# Patient Record
Sex: Female | Born: 1950 | Race: White | Hispanic: No | Marital: Married | Smoking: Never smoker
Health system: Southern US, Community
[De-identification: ages and names within clinical notes are randomized; demographics above are authoritative.]

## PROBLEM LIST (undated history)

## (undated) DIAGNOSIS — Z9089 Acquired absence of other organs: Secondary | ICD-10-CM

## (undated) DIAGNOSIS — T7840XA Allergy, unspecified, initial encounter: Secondary | ICD-10-CM

## (undated) DIAGNOSIS — R112 Nausea with vomiting, unspecified: Secondary | ICD-10-CM

## (undated) DIAGNOSIS — Z9889 Other specified postprocedural states: Secondary | ICD-10-CM

## (undated) DIAGNOSIS — R0600 Dyspnea, unspecified: Secondary | ICD-10-CM

## (undated) DIAGNOSIS — K219 Gastro-esophageal reflux disease without esophagitis: Secondary | ICD-10-CM

## (undated) DIAGNOSIS — M1990 Unspecified osteoarthritis, unspecified site: Secondary | ICD-10-CM

## (undated) DIAGNOSIS — E7800 Pure hypercholesterolemia, unspecified: Secondary | ICD-10-CM

## (undated) HISTORY — DX: Allergy, unspecified, initial encounter: T78.40XA

## (undated) HISTORY — PX: TONSILLECTOMY: SUR1361

## (undated) HISTORY — DX: Acquired absence of other organs: Z90.89

## (undated) HISTORY — DX: Pure hypercholesterolemia, unspecified: E78.00

## (undated) HISTORY — PX: EXTERNAL EAR SURGERY: SHX627

---

## 1985-01-22 HISTORY — PX: TUBAL LIGATION: SHX77

## 2000-01-23 HISTORY — PX: VAGINAL HYSTERECTOMY: SUR661

## 2005-01-04 ENCOUNTER — Ambulatory Visit: Payer: Self-pay | Admitting: Gastroenterology

## 2005-12-10 ENCOUNTER — Ambulatory Visit: Payer: Self-pay | Admitting: Family Medicine

## 2005-12-19 ENCOUNTER — Ambulatory Visit: Payer: Self-pay | Admitting: Family Medicine

## 2006-08-16 ENCOUNTER — Ambulatory Visit: Payer: Self-pay | Admitting: Family Medicine

## 2007-12-12 ENCOUNTER — Emergency Department: Payer: Self-pay | Admitting: Emergency Medicine

## 2008-01-23 HISTORY — PX: BREAST BIOPSY: SHX20

## 2008-02-24 ENCOUNTER — Ambulatory Visit: Payer: Self-pay | Admitting: Family Medicine

## 2008-03-02 ENCOUNTER — Ambulatory Visit: Payer: Self-pay | Admitting: Family Medicine

## 2008-04-15 ENCOUNTER — Ambulatory Visit: Payer: Self-pay | Admitting: Gastroenterology

## 2008-05-31 ENCOUNTER — Ambulatory Visit: Payer: Self-pay | Admitting: Surgery

## 2008-08-24 ENCOUNTER — Ambulatory Visit: Payer: Self-pay | Admitting: Surgery

## 2008-09-07 ENCOUNTER — Ambulatory Visit: Payer: Self-pay | Admitting: Surgery

## 2010-10-12 ENCOUNTER — Ambulatory Visit: Payer: Self-pay | Admitting: Family Medicine

## 2011-10-16 ENCOUNTER — Ambulatory Visit: Payer: Self-pay | Admitting: Family Medicine

## 2012-10-23 ENCOUNTER — Ambulatory Visit: Payer: Self-pay | Admitting: Family Medicine

## 2012-11-10 ENCOUNTER — Ambulatory Visit: Payer: Self-pay | Admitting: Family Medicine

## 2013-06-11 ENCOUNTER — Ambulatory Visit: Payer: Self-pay | Admitting: Gastroenterology

## 2013-11-26 ENCOUNTER — Ambulatory Visit: Payer: Self-pay | Admitting: Family Medicine

## 2013-12-04 ENCOUNTER — Ambulatory Visit: Payer: Self-pay | Admitting: Family Medicine

## 2015-03-29 ENCOUNTER — Other Ambulatory Visit: Payer: Self-pay | Admitting: Family Medicine

## 2015-03-29 DIAGNOSIS — R1011 Right upper quadrant pain: Secondary | ICD-10-CM

## 2015-04-05 ENCOUNTER — Ambulatory Visit
Admission: RE | Admit: 2015-04-05 | Discharge: 2015-04-05 | Disposition: A | Discharge status: Home / Self Care | Payer: BLUE CROSS/BLUE SHIELD | Source: Ambulatory Visit | Attending: Family Medicine | Admitting: Family Medicine

## 2015-04-05 DIAGNOSIS — R1011 Right upper quadrant pain: Secondary | ICD-10-CM

## 2015-04-06 ENCOUNTER — Ambulatory Visit
Admission: RE | Admit: 2015-04-06 | Discharge: 2015-04-06 | Disposition: A | Discharge status: Home / Self Care | Payer: BLUE CROSS/BLUE SHIELD | Source: Ambulatory Visit | Attending: Family Medicine | Admitting: Family Medicine

## 2015-04-06 DIAGNOSIS — R1011 Right upper quadrant pain: Secondary | ICD-10-CM | POA: Insufficient documentation

## 2017-09-04 ENCOUNTER — Other Ambulatory Visit: Payer: Self-pay | Admitting: Family Medicine

## 2017-09-04 DIAGNOSIS — Z1231 Encounter for screening mammogram for malignant neoplasm of breast: Secondary | ICD-10-CM

## 2017-09-18 ENCOUNTER — Encounter: Payer: Self-pay | Admitting: Radiology

## 2017-09-18 ENCOUNTER — Ambulatory Visit
Admission: RE | Admit: 2017-09-18 | Discharge: 2017-09-18 | Disposition: A | Discharge status: Home / Self Care | Payer: Medicare Other | Source: Ambulatory Visit | Attending: Family Medicine | Admitting: Family Medicine

## 2017-09-18 DIAGNOSIS — Z1231 Encounter for screening mammogram for malignant neoplasm of breast: Secondary | ICD-10-CM | POA: Insufficient documentation

## 2017-10-11 ENCOUNTER — Encounter: Payer: Self-pay | Admitting: Physical Therapy

## 2017-10-11 ENCOUNTER — Other Ambulatory Visit: Payer: Self-pay

## 2017-10-11 ENCOUNTER — Ambulatory Visit: Payer: Medicare Other | Attending: Obstetrics and Gynecology | Admitting: Physical Therapy

## 2017-10-11 DIAGNOSIS — M6281 Muscle weakness (generalized): Secondary | ICD-10-CM | POA: Diagnosis present

## 2017-10-11 DIAGNOSIS — R29898 Other symptoms and signs involving the musculoskeletal system: Secondary | ICD-10-CM | POA: Insufficient documentation

## 2017-10-11 DIAGNOSIS — Z90710 Acquired absence of both cervix and uterus: Secondary | ICD-10-CM | POA: Insufficient documentation

## 2017-10-11 DIAGNOSIS — R2689 Other abnormalities of gait and mobility: Secondary | ICD-10-CM | POA: Diagnosis not present

## 2017-10-11 DIAGNOSIS — R279 Unspecified lack of coordination: Secondary | ICD-10-CM | POA: Diagnosis present

## 2017-10-11 NOTE — Therapy (Addendum)
[place] MAIN [facility] SERVICES [place], [place], [address] Phone: [phone]   Fax:  [phone]  Physical Therapy Evaluation  Patient Details  Name: Kaitlyn Ponce MRN: [id number] Date of Birth: 1950/02/08 Referring Provider (PT): Anders Grant Date: 10/11/2017    Past Medical History:  Diagnosis Date  . Allergy    cat, dust  . High cholesterol    taking medication, declined nutritional referral at this time 10/11/17  . History of tonsillectomy     Past Surgical History:  Procedure Laterality Date  . BREAST BIOPSY Left 2010   [place]  . TUBAL LIGATION  1987  . VAGINAL HYSTERECTOMY  2002    There were no vitals filed for this visit.     Subjective Assessment - 10/11/17 0843    Subjective 1) Pt has been feeling the prolapse for one year but didn't know what it once. It felt like "my insides were falling out."  Bowel movements occur 1-2 x day without straining with Stool Type 4. Denied urinary frequency, straining with bowel movements, initiating urination, urnary /fecal leakage.  The pressure feeling occurs when she is rushed, lifting heavy objects (gardening, bending, weeding, carrying thing upstairs, standing/cooking for all periods of time).  This pressure comes and goes and it is not constant.    2) CLBP occurs occasionally especially with cooking which invoves standing for 45 min. Pt has to sit done and rest for 30 min.  Pain level 6-7/10 as a dull ache that is not debilitating. Denied radiating pain, nor with bowel and bladder Sx.     3) B knee pain with R worse than L. Sitting with no pain. There are times when her knees feels like they have locked in the wrong position. This happens 1-2 x week.  Pt has hto move it to "literally" it has to click back in place". Pt 's orthopedic doctor has advised for pt to lose weight before being eligible for knee pain. Pt declined a cortisone shot offered by the PA at  her orthopedic office.  After walking 2 miles, her legs ache and she has to stop and has to rest for 5 minutes.  Pt's weight loss efforts include weaning off sweet tea ( 5) 16 fl oz to (1) per day and increasing water from none to 50 fl oz.  Pt also has 1 cup of coffee in the morning and evening.    4) Nocturia 1 x / night without leakage. Pt has never been tested for OSA. Pt knows she snores because when she is on her back. she wakes herself up.      Pertinent History    Umbilical Hernia for 1 year under monitoring, Gynecological Hx: large baby with 1st pregnancy which required stitches, vaginal hysterectomy, and tubal ligation. Pt is retired. Pt usually has her legs elevated on an ottoman when reading a book or sitting.  Walking daily 2 miles with some incline. Pt has a pool outdoors but will not be using it with the season change. Pt used the pool for walking earlier in the summer. Pt will take time to consider pool therapy for strengthening legs.            [facility] PT Assessment - 10/11/17 0911      Assessment   Medical Diagnosis  pelvic organ prolapse     Referring Provider  [facility]       Precautions   Precautions  None  Restrictions   Weight Bearing Restrictions  No      Balance Screen   Has the patient fallen in the past 6 months  No      AROM   Overall AROM Comments  WFL in rotation, sidebend, flexion, ext      Strength   Overall Strength  --       Overall Strength Comments  R hip /knee flex, knee ext 4/5, L3+/5. hip extR 4-/5L 2/5, hip abd R 4+/5, L 2/5       Palpation   Spinal mobility  dowager's hump     Palpation comment  L Iliac crest higher than R.  82.5 cm RLE, 84 cm L         Ambulation/Gait   Gait velocity  1.08 m/s ( after 1.20 m/s with shoe lift in R shoe)     Gait Comments  L lateral pelvic shift, decreased R stance ( no L lateral shift w/ shoe lift in R shoe)                  Objective measurements completed on examination: See above  findings.    Pelvic Floor Special Questions - 10/11/17 0925    Diastasis Recti  neg but an umbilical hernia convex 2 cm at its peak                     PT Long Term Goals - 10/11/17 0920      PT LONG TERM GOAL #1   Title  Pt will increase her hip ext/ abd strength on her L from 2/5 to > 4+/5 in order to equalize her strength to R LE for improved knee pain and to navigate stairs     Time  8    Period  Weeks    Status  New    Target Date  12/06/17      PT LONG TERM GOAL #2   Title  Pt will demo equal pelvic alignment of iliac crest across 2 visits with compliance of shoe lift and increase gait speed 1.3 m/s in order to walk longer distances     Time  12    Period  Weeks    Status  New    Target Date  01/03/18      PT LONG TERM GOAL #3   Title  Pt will report decreased pain at the back from 6-7/10 to <3/10 and knee pain 5-6/10 to <3/10 after standing in one place while cooking for 45 min in order to coo for family      Time  4    Period  Weeks    Status  New    Target Date  11/08/17      PT LONG TERM GOAL #4   Title  Pt will increase her LEFS score from 46 pts to > 66 pts in order to improve knee function  and decrease pain     Time  10    Period  Weeks    Status  New    Target Date  12/20/17                     Objective measurements completed on examination: See above findings.                       Plan - 10/19/17 1701     Pt is a 67 yo female who reports of prolapse-related pressure  feeling in her pelvic area, non-radiating CLBP, b knee pain ( R> L), and nocturia. These Sx impact her QOL and ADLs. Pt's clinical presentations include leg length difference, hip weakness, gait deviations, slowed gait, dyscoordination of deep core mm, and poor body mechanics. Following Tx, pt showed faster gait speed with less deviations when wearing shoe lift in RLE. Plan to assess pelvic floor to address prolapse at next session.  Regional  interdependent approaches will yield greater benefits in pt's POC due to the complexity of her medical Hx and Sx at multiple body parts at lower kinetic chain.  Pt was provided education on etiology of Sx with anatomy, physiology explanation with images along with the benefits of customized pelvic PT Tx based on her medical conditions and musculoskeletal deficits.     Clinical Presentation  Evolving    Clinical Decision Making  Moderate    Rehab Potential  Good    PT Frequency  1x / week    PT Duration  12 weeks    PT Treatment/Interventions  Therapeutic activities;Neuromuscular re-education;Therapeutic exercise;Moist Heat;Electrical Stimulation;Balance training;Gait training;Energy conservation;Manual techniques;Scar mobilization;Taping;Patient/family education;Traction;Biofeedback;Aquatic Therapy    Consulted and Agree with Plan of Care  Patient       Patient will benefit from skilled therapeutic intervention in order to improve the following deficits and impairments:  Decreased activity tolerance, Decreased coordination, Decreased scar mobility, Decreased range of motion, Decreased safety awareness, Decreased strength, Hypermobility, Pain, Postural dysfunction, Decreased mobility, Decreased endurance, Improper body mechanics, Hypomobility, Increased muscle spasms, Impaired sensation, Increased fascial restricitons  Visit Diagnosis: Other symptoms and signs involving the musculoskeletal system  H/O vaginal hysterectomy  Unspecified lack of coordination  Muscle weakness (generalized)     Problem List Patient Active Problem List   Diagnosis Date Noted  . H/O vaginal hysterectomy     [name] ,PT, DPT, E-RYT   10/19/2017, 5:02 PM  [place] MAIN [facility] SERVICES [address] [place], [place], [address] Phone: [phone]   Fax:  [phone]  Name: Shavonne Ambroise MRN: [id number] Date of Birth: [birth year]

## 2017-10-11 NOTE — Patient Instructions (Addendum)
Wear shoe lift in R shoe to level up your hips    _________  Strengthen L hip:  Clam shells  on laying on R side Knees bent, pillow between knees  Left L knee up before the point of rocking hips back ( keep the front of hips pointed ahead)  30 reps   X 2 x day   ___________  Stand at the [employer] with hands on counter and tapping back with each leg 30 reps  X 3 reps    _____________    Proper body mechanics with getting out of a chair to decrease strain  on back &pelvic floor   Avoid holding your breath when Getting out of the chair:  Scoot to front part of chair chair Heels behind feet, feet are hip width apart, nose over toes  Inhale like you are smelling roses Exhale to stand

## 2017-10-14 ENCOUNTER — Ambulatory Visit: Payer: Medicare Other | Admitting: Physical Therapy

## 2017-10-14 DIAGNOSIS — M6281 Muscle weakness (generalized): Secondary | ICD-10-CM

## 2017-10-14 DIAGNOSIS — R29898 Other symptoms and signs involving the musculoskeletal system: Secondary | ICD-10-CM

## 2017-10-14 DIAGNOSIS — Z90710 Acquired absence of both cervix and uterus: Secondary | ICD-10-CM

## 2017-10-14 DIAGNOSIS — R279 Unspecified lack of coordination: Secondary | ICD-10-CM

## 2017-10-14 NOTE — Patient Instructions (Addendum)
Deep core level 1 ( with quick squeeze)   Deep core level 2 ( 6 min )  2x  Day for both See handout)   ______  Hold off on clams since you experienced cramps in the morning when doing them  ____ Stretch L pelvic floor with scissors movements/ modification for clams   Inhale Exhale dragging heel out and in with knees straight 30 reps    _____    Avoid straining pelvic floor, abdominal muscles , spine  Use log rolling technique instead of getting out of bed with your neck or the sit-up   Log rolling into and out of .bed  With sidelying position first

## 2017-10-15 ENCOUNTER — Encounter: Payer: Medicare Other | Admitting: Physical Therapy

## 2017-10-16 NOTE — Therapy (Addendum)
[place] MAIN [facility] SERVICES [address] [place], [place], [address] Phone: [phone]   Fax:  [phone]  Physical Therapy Treatment  Patient Details  Name: Sparkles Mcneely MRN: [id number] Date of Birth: 23-Sep-1950 Referring Provider (PT): Leafy Ro    Encounter Date: 10/14/2017  PT End of Session - 10/19/17 1736    Visit Number  2    Number of Visits  12    Authorization Type  medicare     PT Start Time  2725    PT Stop Time  1515    PT Time Calculation (min)  60 min    Activity Tolerance  Patient tolerated treatment well;No increased pain    Behavior During Therapy  WFL for tasks assessed/performed       Past Medical History:  Diagnosis Date  . Allergy    cat, dust  . High cholesterol    taking medication, declined nutritional referral at this time 10/11/17  . History of tonsillectomy     Past Surgical History:  Procedure Laterality Date  . BREAST BIOPSY Left 2010   [place] core  . TUBAL LIGATION  1987  . VAGINAL HYSTERECTOMY  2002    There were no vitals filed for this visit.  Subjective Assessment - 10/19/17 1731    Subjective  Pt has not had the sinking feeling since seeing Leafy Ro even with active activities. Pt notices knee pain when doing her clam shell exercises in the morning.     Pertinent History  Umbilical Hernia for 1 year under monitoring, Gynecological Hx: large baby with 1st pregnancy which required stitches, vaginal hysterectomy, and tubal ligation. Pt is retired. Pt usually has her legs elevated on an ottoman when reading a book or sitting.  Walking daily 2 miles with some incline. Pt has a pool outdoors but will not be using it with the season change. Pt used the pool for walking earlier in the summer. Pt will take time to consider pool therapy for strengthening legs.            [facility] PT Assessment - 10/19/17 1739      Coordination   Gross Motor Movements are Fluid and Coordinated  --   minor cues for  deep core exercises     Bed Mobility   Right Sidelying to Sit  --    by hooking legs unde bed, breathholding, strainning of knee     Pelvic floor assessment: pt consented verbally without contraindication  cued for cranial movemetn of pelvic floor              [facility] Adult PT Treatment/Exercise - 10/19/17 1732      Therapeutic Activites    Therapeutic Activities  --   see pt instructions     Neuro Re-ed    Neuro Re-ed Details   see pt instructions                  PT Long Term Goals - 10/11/17 0920      PT LONG TERM GOAL #1   Title  Pt will increase her hip ext/ abd strength on her L from 2/5 to > 4+/5 in order to equalize her strength to R LE for improved knee pain and to navigate stairs     Time  8    Period  Weeks    Status  New    Target Date  12/06/17      PT LONG TERM GOAL #2   Title  Pt will demo equal pelvic alignment of iliac crest across 2 visits with compliance of shoe lift and increase gait speed 1.3 m/s in order to walk longer distances     Time  12    Period  Weeks    Status  New    Target Date  01/03/18      PT LONG TERM GOAL #3   Title  Pt will report decreased pain at the back from 6-7/10 to <3/10 and knee pain 5-6/10 to <3/10 after standing in one place while cooking for 45 min in order to coo for family      Time  4    Period  Weeks    Status  New    Target Date  11/08/17      PT LONG TERM GOAL #4   Title  Pt will increase her LEFS score from 46 pts to > 66 pts in order to improve knee function  and decrease pain     Time  10    Period  Weeks    Status  New    Target Date  12/20/17            Plan - 10/19/17 1737    Clinical Impression Statement  Withheld clam shells due to report of knee pain. Pt demo'd poor body mechanics with sidelying to sit which is likely attributing to her knee pain as she demo'd hooking her legs under plinth to sit up. Pt also demo'd breathholding which places her at increased risks for  worsening of her prolapse. Pt demo'd correct log rolling technique after education on the importance of correct body mechanics.  Progress pt to deep core strengthenign exercises. Pt continues to benefit from skilled PT.       Patient will benefit from skilled therapeutic intervention in order to improve the following deficits and impairments:  Decreased activity tolerance, Decreased coordination  Visit Diagnosis: H/O vaginal hysterectomy  Other symptoms and signs involving the musculoskeletal system  Unspecified lack of coordination  Muscle weakness (generalized)     Problem List Patient Active Problem List   Diagnosis Date Noted  . H/O vaginal hysterectomy     [name] ,PT, DPT, E-RYT  10/19/2017, 5:45 PM  [place] MAIN [facility] SERVICES [address] [place], [place], [address] Phone: [phone]   Fax:  [phone]  Name: Emilyrose Darrah MRN: [id number] Date of Birth: Mar 14, 1950

## 2017-10-19 NOTE — Addendum Note (Signed)
Addended by: [name] on: 10/19/2017 05:25 PM   Modules accepted: Orders

## 2017-10-21 ENCOUNTER — Ambulatory Visit: Payer: Medicare Other | Admitting: Physical Therapy

## 2017-10-21 DIAGNOSIS — Z90710 Acquired absence of both cervix and uterus: Secondary | ICD-10-CM

## 2017-10-21 DIAGNOSIS — R279 Unspecified lack of coordination: Secondary | ICD-10-CM

## 2017-10-21 DIAGNOSIS — R29898 Other symptoms and signs involving the musculoskeletal system: Secondary | ICD-10-CM | POA: Diagnosis not present

## 2017-10-21 DIAGNOSIS — M6281 Muscle weakness (generalized): Secondary | ICD-10-CM

## 2017-10-21 NOTE — Patient Instructions (Addendum)
1) L thigh over R thigh stretch 5 breaths    2) Stretch for pelvic floor   "v heels slide away and then back toward buttocks and then rock knee to slight ,  slide heel along at 11 o clock away from buttocks   10 reps    _____  Cleaning pool position:  ski track stance with 40 %-60% weight shift between legs   feet placement important    Standing with equal weight on both legs

## 2017-10-21 NOTE — Therapy (Signed)
[place] MAIN [facility] SERVICES [address] [place], [place], [address] Phone: [phone]   Fax:  [phone]  Physical Therapy Treatment  Patient Details  Name: Kaitlyn Ponce MRN: [id number] Date of Birth: 25-Oct-1950 Referring Provider (PT): Leafy Ro    Encounter Date: 10/21/2017  PT End of Session - 10/21/17 1806    Visit Number  3    Number of Visits  12    Authorization Type  medicare     PT Start Time  8250    PT Stop Time  5397    PT Time Calculation (min)  60 min    Activity Tolerance  Patient tolerated treatment well;No increased pain    Behavior During Therapy  WFL for tasks assessed/performed       Past Medical History:  Diagnosis Date  . Allergy    cat, dust  . High cholesterol    taking medication, declined nutritional referral at this time 10/11/17  . History of tonsillectomy     Past Surgical History:  Procedure Laterality Date  . BREAST BIOPSY Left 2010   [place] core  . TUBAL LIGATION  1987  . VAGINAL HYSTERECTOMY  2002    There were no vitals filed for this visit.  Subjective Assessment - 10/21/17 1713    Subjective  Pt reports she can feel the slight noise i nher hp when doing the deep core level 2 . Pt got up the right way out of bed. Pt reported feelign her prolapse feeling after cleaning her pool, cooking , and then taking a shower.     Pertinent History  Umbilical Hernia for 1 year under monitoring, Gynecological Hx: large baby with 1st pregnancy which required stitches, vaginal hysterectomy, and tubal ligation. Pt is retired. Pt usually has her legs elevated on an ottoman when reading a book or sitting.  Walking daily 2 miles with some incline. Pt has a pool outdoors but will not be using it with the season change. Pt used the pool for walking earlier in the summer. Pt will take time to consider pool therapy for strengthening legs.            [facility] PT Assessment - 10/21/17 1724      Observation/Other  Assessments   Observations  standing posture, non-equal weightbearing      Other:   Other/ Comments  simulated cleaning pool: pt demo'd excessive forward bending , poor leg/feet propioception      Palpation   Palpation comment  tenderness at L glut med with signficant tensions                 Pelvic Floor Special Questions - 10/21/17 1759    Prolapse  Posterior Wall   within introitus. more cranial position post Tx. Pt reported posterior falling feeling after therapeutic activity training.    Pelvic Floor Internal Exam  pt consented verbally without contraindications     Exam Type  Vaginal    Palpation  tenderness/ tensions at epiosiotomy scar 5 o'clock position. limited activity on L pelvic floor, abdominal overuse     Strength  weak squeeze, no lift        [facility] Adult PT Treatment/Exercise - 10/21/17 1800      Therapeutic Activites    Therapeutic Activities  --   see pt instructions     Neuro Re-ed    Neuro Re-ed Details   see pt instructions      Modalities   Modalities  Moist Heat  Moist Heat Therapy   Number Minutes Moist Heat  8 Minutes    Moist Heat Location  --   glut med L,perineum (pillow case_bed sheet separating skin     Manual Therapy   Manual therapy comments  external technique at ischioanal fossa/ ischial rami L with MWM     glut med L with STM/ sustained pressure   Internal Pelvic Floor  STM/MWM at 5 oclock scar . deferred to external techniqes due to report of pain              PT Education - 10/21/17 1811    Education Details  HEP    Person(s) Educated  Patient    Methods  Demonstration;Tactile cues;Verbal cues;Handout    Comprehension  Verbalized understanding;Returned demonstration          PT Long Term Goals - 10/11/17 0920      PT LONG TERM GOAL #1   Title  Pt will increase her hip ext/ abd strength on her L from 2/5 to > 4+/5 in order to equalize her strength to R LE for improved knee pain and to navigate stairs      Time  8    Period  Weeks    Status  New    Target Date  12/06/17      PT LONG TERM GOAL #2   Title  Pt will demo equal pelvic alignment of iliac crest across 2 visits with compliance of shoe lift and increase gait speed 1.3 m/s in order to walk longer distances     Time  12    Period  Weeks    Status  New    Target Date  01/03/18      PT LONG TERM GOAL #3   Title  Pt will report decreased pain at the back from 6-7/10 to <3/10 and knee pain 5-6/10 to <3/10 after standing in one place while cooking for 45 min in order to coo for family      Time  4    Period  Weeks    Status  New    Target Date  11/08/17      PT LONG TERM GOAL #4   Title  Pt will increase her LEFS score from 46 pts to > 66 pts in order to improve knee function  and decrease pain     Time  10    Period  Weeks    Status  New    Target Date  12/20/17            Plan - 10/21/17 1822    Clinical Impression Statement  Pt showed decreased episiotomy scar restrictions w/ report of decreased tenderness post internal and external Tx. Deferred to external techqniues to release scar region after pt reported pain with internal technique. Manual Tx also addressed increased L glut med tightness and tenderness. Suspect overactivity of L LE due to shorter RLE and avoiding R knee pain.  Added pelvic floor stretches and hip stretches into HEP. Withholding pelvic floor strengthening until tensions and scar restrictions subside. Added body mechanics training for standing and cleaning pool to minimize excessive strain on back and pelvic floor.  Pt continues to benefit from skilled PT.     Rehab Potential  Good    PT Frequency  1x / week    PT Duration  12 weeks    PT Treatment/Interventions  Therapeutic activities;Neuromuscular re-education;Therapeutic exercise;Moist Heat;Electrical Stimulation;Balance training;Gait training;Energy conservation;Manual techniques;Scar mobilization;Taping;Patient/family  education;Traction;Biofeedback;Aquatic Therapy  Consulted and Agree with Plan of Care  Patient       Patient will benefit from skilled therapeutic intervention in order to improve the following deficits and impairments:  Decreased activity tolerance, Decreased coordination, Decreased scar mobility, Decreased range of motion, Decreased safety awareness, Decreased strength, Hypermobility, Pain, Postural dysfunction, Decreased mobility, Decreased endurance, Improper body mechanics, Hypomobility, Increased muscle spasms, Impaired sensation, Increased fascial restricitons  Visit Diagnosis: H/O vaginal hysterectomy  Other symptoms and signs involving the musculoskeletal system  Unspecified lack of coordination  Muscle weakness (generalized)     Problem List Patient Active Problem List   Diagnosis Date Noted  . H/O vaginal hysterectomy     [name] ,PT, DPT, E-RYT  10/21/2017, 6:22 PM  [place] MAIN [facility] SERVICES [address] [place], [place], [address] Phone: [phone]   Fax:  [phone]  Name: Kaitlyn Ponce MRN: [id number] Date of Birth: 08/20/1950

## 2017-10-28 ENCOUNTER — Ambulatory Visit: Payer: Medicare Other | Attending: Obstetrics and Gynecology | Admitting: Physical Therapy

## 2017-10-28 DIAGNOSIS — Z90710 Acquired absence of both cervix and uterus: Secondary | ICD-10-CM | POA: Diagnosis present

## 2017-10-28 DIAGNOSIS — R29898 Other symptoms and signs involving the musculoskeletal system: Secondary | ICD-10-CM

## 2017-10-28 DIAGNOSIS — M6281 Muscle weakness (generalized): Secondary | ICD-10-CM | POA: Insufficient documentation

## 2017-10-28 DIAGNOSIS — R279 Unspecified lack of coordination: Secondary | ICD-10-CM | POA: Insufficient documentation

## 2017-10-28 NOTE — Patient Instructions (Signed)
   WALKING WITH RESISTANCE   62min BLUE Band at waist connected to doorknob 48mins Stepping forward normal length steps, planting mid and forefoot down, center of mass ( navel) leans forward slightly as if you were walking uphill 3-4 steps till band feels taut ( MAKE SURE THE DOOR IS LOCKED AND WON'T OPEN)   Stepping backwards, lower heel slowly, carry trunk and hips back as you step   _____ To minimize knee pain and co-activate pelvic floor   Walking with more anterior center of mass, higher thighs, landing on ballmounds  on the ground "pink panther"   _____  PELVIC FLOOR / KEGEL EXERCISES   Pelvic floor/ Kegel exercises are used to strengthen the muscles in the base of your pelvis that are responsible for supporting your pelvic organs and preventing urine/feces leakage. Based on your therapist's recommendations, they can be performed while standing, sitting, or lying down. Imagine pelvic floor area as a diamond with pelvic landmarks: top =pubic bone, bottom tip=tailbone, sides=sitting bones (ischial tuberosities).    Make yourself aware of this muscle group by using these cues while coordinating your breath:  Inhale, feel pelvic floor diamond area lower like hammock towards your feet and ribcage/belly expanding. Pause. Let the exhale naturally and feel your belly sink, abdominal muscles hugging in around you and you may notice the pelvic diamond draws upward towards your head forming a umbrella shape. Give a squeeze during the exhalation like you are stopping the flow of urine. If you are squeezing the buttock muscles, try to give 50% less effort.   Common Errors:  Breath holding: If you are holding your breath, you may be bearing down against your bladder instead of pulling it up. If you belly bulges up while you are squeezing, you are holding your breath. Be sure to breathe gently in and out while exercising. Counting out loud may help you avoid holding your breath.  Accessory muscle  use: You should not see or feel other muscle movement when performing pelvic floor exercises. When done properly, no one can tell that you are performing the exercises. Keep the buttocks, belly and inner thighs relaxed.  Overdoing it: Your muscles can fatigue and stop working for you if you over-exercise. You may actually leak more or feel soreness at the lower abdomen or rectum.  YOUR HOME EXERCISE PROGRAM     SHORT HOLDS: Position: on back  Inhale and then exhale. Then squeeze the muscle.  (Be sure to let belly sink in with exhales and not push outward)  10 reps  X 4 x day                       DECREASE DOWNWARD PRESSURE ON  YOUR PELVIC FLOOR, ABDOMINAL, LOW BACK MUSCLES       PRESERVE YOUR PELVIC HEALTH LONG-TERM   ** SQUEEZE pelvic floor BEFORE YOUR SNEEZE, COUGH, LAUGH   ** EXHALE BEFORE YOU RISE AGAINST GRAVITY (lifting, sit to stand, from squat to stand)   ** LOG ROLL OUT OF BED INSTEAD OF CRUNCH/SIT-UP

## 2017-10-28 NOTE — Therapy (Signed)
[place] MAIN [facility] SERVICES [address] [place], [place], [address] Phone: [phone]   Fax:  [phone]  Physical Therapy Treatment  Patient Details  Name: Kaitlyn Ponce MRN: [id number] Date of Birth: [birth year] Referring Provider (PT): Leafy Ro    Encounter Date: 10/28/2017  PT End of Session - 10/28/17 2211    Visit Number  4    Number of Visits  12    Authorization Type  medicare     PT Start Time  1700    PT Stop Time  8676    PT Time Calculation (min)  45 min    Activity Tolerance  Patient tolerated treatment well;No increased pain    Behavior During Therapy  WFL for tasks assessed/performed       Past Medical History:  Diagnosis Date  . Allergy    cat, dust  . High cholesterol    taking medication, declined nutritional referral at this time 10/11/17  . History of tonsillectomy     Past Surgical History:  Procedure Laterality Date  . BREAST BIOPSY Left 2010   [place]  . TUBAL LIGATION  1987  . VAGINAL HYSTERECTOMY  2002    There were no vitals filed for this visit.  Subjective Assessment - 10/28/17 1702    Subjective  Pt reported she did not do one exercise because her R knee pain increased with the figure-4.  Pt found the standing/ mopping/ pool cleaning modifications have helped her LBP     Pertinent History  Umbilical Hernia for 1 year under monitoring, Gynecological Hx: large baby with 1st pregnancy which required stitches, vaginal hysterectomy, and tubal ligation. Pt is retired. Pt usually has her legs elevated on an ottoman when reading a book or sitting.  Walking daily 2 miles with some incline. Pt has a pool outdoors but will not be using it with the season change. Pt used the pool for walking earlier in the summer. Pt will take time to consider pool therapy for strengthening legs.            [facility] PT Assessment - 10/28/17 1730      Palpation   Palpation comment  hypomobility at midfoot/hindfoot and  limited into DF/eversion ( increased postTx)       Ambulation/Gait   Gait Comments  supination, heek strike, posterior COM ( improved with cues and manual Tx , demo'ing anterior COM, midfoot strike, increased hip flexion)                 Pelvic Floor Special Questions - 10/28/17 1729    Prolapse  Posterior Wall   within introitus. more cranial position post Tx   Pelvic Floor Internal Exam  pt consented verbally without contraindications     Exam Type  Vaginal    Palpation  no tenderness/ tensions     Strength  fair squeeze, definite lift    Strength # of reps  10    Strength # of seconds  1        [facility] Adult PT Treatment/Exercise - 10/28/17 1730      Therapeutic Activites    Therapeutic Activities  --   see pt instructions; gait training      Neuro Re-ed    Neuro Re-ed Details   see pt instructions      Manual Therapy   Manual therapy comments  distraction / superior/inferior mob Grade II along rays in B feet to increase eversion, decrease STM  Internal Pelvic Floor  neuromuscular re-edu ( biofeedback with digital palpation) for quick pelvic floor contractions                   PT Long Term Goals - 10/28/17 1704      PT LONG TERM GOAL #1   Title  Pt will increase her hip ext/ abd strength on her L from 2/5 to > 4+/5 in order to equalize her strength to R LE for improved knee pain and to navigate stairs     Time  8    Period  Weeks    Status  On-going      PT LONG TERM GOAL #2   Title  Pt will demo equal pelvic alignment of iliac crest across 2 visits with compliance of shoe lift and increase gait speed 1.3 m/s in order to walk longer distances     Time  12    Period  Weeks    Status  On-going      PT LONG TERM GOAL #3   Title  Pt will report decreased pain at the back from 6-7/10 to <3/10 and knee pain 5-6/10 to <3/10 after standing in one place while cooking for 45 min in order to coo for family      Time  4    Period  Weeks    Status   On-going      PT LONG TERM GOAL #4   Title  Pt will increase her LEFS score from 46 pts to > 66 pts in order to improve knee function  and decrease pain     Time  10    Period  Weeks    Status  On-going      PT LONG TERM GOAL #5   Title  Pt will demo improved gait mechanics : hip flexion, less supination, more DF/ eversion, wider BOS, less heel striking and report no knee pain on her regular walks in order to maintain fitness routine     Time  8    Period  Weeks    Status  New    Target Date  12/23/17            Plan - 10/28/17 2211    Clinical Impression Statement  Pt demo'd no tenderness/ tightness of pelvic floor today. Progressed pt to quick contractions of pelvic floor strengthening in supine. Pt demo'd correctly. Advanced pt to co-activation of deep core mm while walking.  Manual Tx facilitated mobility in B feet which originally demo'd pes planus and limitation in DF/eversion.  Pt's gait improved with neuro-muscular re-education.     Rehab Potential  Good    PT Frequency  1x / week    PT Duration  12 weeks    PT Treatment/Interventions  Therapeutic activities;Neuromuscular re-education;Therapeutic exercise;Moist Heat;Electrical Stimulation;Balance training;Gait training;Energy conservation;Manual techniques;Scar mobilization;Taping;Patient/family education;Traction;Biofeedback;Aquatic Therapy    Consulted and Agree with Plan of Care  Patient       Patient will benefit from skilled therapeutic intervention in order to improve the following deficits and impairments:  Decreased activity tolerance, Decreased coordination, Decreased scar mobility, Decreased range of motion, Decreased safety awareness, Decreased strength, Hypermobility, Pain, Postural dysfunction, Decreased mobility, Decreased endurance, Improper body mechanics, Hypomobility, Increased muscle spasms, Impaired sensation, Increased fascial restricitons  Visit Diagnosis: Other symptoms and signs involving the  musculoskeletal system  Unspecified lack of coordination  Muscle weakness (generalized)     Problem List Patient Active Problem List   Diagnosis Date Noted  .  H/O vaginal hysterectomy     [name] ,PT, DPT, E-RYT  10/28/2017, 10:17 PM  [place] MAIN [facility] SERVICES [address] [place], [place], [address] Phone: [phone]   Fax:  [phone]  Name: Kaitlyn Ponce MRN: [id number] Date of Birth: [birth year]

## 2017-11-04 ENCOUNTER — Ambulatory Visit: Payer: Medicare Other | Admitting: Physical Therapy

## 2017-11-04 DIAGNOSIS — R29898 Other symptoms and signs involving the musculoskeletal system: Secondary | ICD-10-CM | POA: Diagnosis not present

## 2017-11-04 DIAGNOSIS — M6281 Muscle weakness (generalized): Secondary | ICD-10-CM

## 2017-11-04 DIAGNOSIS — Z90710 Acquired absence of both cervix and uterus: Secondary | ICD-10-CM

## 2017-11-04 DIAGNOSIS — R279 Unspecified lack of coordination: Secondary | ICD-10-CM

## 2017-11-04 NOTE — Patient Instructions (Addendum)
Lunge, heel up and down ( knee bend. Straight)  Front knee above ankle   10 reps    _______  Press ballmound down , keep knees out in sit to stand    Proper body mechanics with getting out of a chair to decrease strain  on back &pelvic floor   Avoid holding your breath when Getting out of the chair:  Scoot to front part of chair chair Heels behind feet, feet are hip width apart, nose over toes  Inhale like you are smelling roses Exhale to stand   _________   REMOVE HEEL LIFT THIS WEEK    _________  Wear pedicure toe spreader when reading 30 min per day  _________  Stairs:  instead of caring laundry basket up/ down, place clothes inside book bag and then fold upstairs

## 2017-11-05 NOTE — Therapy (Signed)
[place] MAIN [facility] SERVICES [address] [place], [place], [address] Phone: [phone]   Fax:  [phone]  Physical Therapy Treatment  Patient Details  Name: Kaitlyn Ponce MRN: [id number] Date of Birth: November 17, 1950 Referring Provider (PT): Leafy Ro    Encounter Date: 11/04/2017  PT End of Session - 11/04/17 1711    Visit Number  5    Number of Visits  12    Authorization Type  medicare     PT Start Time  1708    PT Stop Time  1820    PT Time Calculation (min)  72 min    Activity Tolerance  Patient tolerated treatment well;No increased pain    Behavior During Therapy  WFL for tasks assessed/performed       Past Medical History:  Diagnosis Date  . Allergy    cat, dust  . High cholesterol    taking medication, declined nutritional referral at this time 10/11/17  . History of tonsillectomy     Past Surgical History:  Procedure Laterality Date  . BREAST BIOPSY Left 2010   [place]  . TUBAL LIGATION  1987  . VAGINAL HYSTERECTOMY  2002    There were no vitals filed for this visit.  Subjective Assessment - 11/04/17 1711    Subjective  Pt reported feeling the prolapse very slight even when she has been on her feet all day. Pt's knee pain still occurs only when getting up from sitting and turning before moving her feet when standing. Pt is not walking inthe pool anymore but is walking on land 1 miles/ day without knee pain.  Pt reports R knee pain has increased since wearing shoe lift      Pertinent History  Umbilical Hernia for 1 year under monitoring, Gynecological Hx: large baby with 1st pregnancy which required stitches, vaginal hysterectomy, and tubal ligation. Pt is retired. Pt usually has her legs elevated on an ottoman when reading a book or sitting.  Walking daily 2 miles with some incline. Pt has a pool outdoors but will not be using it with the season change. Pt used the pool for walking earlier in the summer. Pt will take  time to consider pool therapy for strengthening legs.            [facility] PT Assessment - 11/05/17 0815      AROM   Overall AROM Comments  B digit II adducted 20 deg       Strength   Overall Strength Comments  L knee flexion/hip IR/ ER 3+/5, R 4/5       Palpation   Palpation comment  medial cuneiform R hypomobility into PF/ INV direction , hypomobility of fibula in superior direction, increased tensions fibular longus , brevis, ext digitorum longus R                     [facility] Adult PT Treatment/Exercise - 11/05/17 0816      Neuro Re-ed    Neuro Re-ed Details   see pt instructions      Manual Therapy   Manual therapy comments  STM along problem areas noted in assessment, AP/PA mob of head of fibula                   PT Long Term Goals - 11/04/17 1712      PT LONG TERM GOAL #1   Title  Pt will increase her hip ext/ abd strength on her L  from 2/5 to > 4+/5 in order to equalize her strength to R LE for improved knee pain and to navigate stairs     Time  8    Period  Weeks    Status  On-going      PT LONG TERM GOAL #2   Title  Pt will demo equal pelvic alignment of iliac crest across 2 visits and increase gait speed 1.3 m/s in order to walk longer distances     Time  12    Period  Weeks    Status  Revised      PT LONG TERM GOAL #3   Title  Pt will report decreased pain at the back from 6-7/10 to <3/10 and knee pain 5-6/10 to <3/10 after standing in one place while cooking for 45 min in order to cook for family      Time  4    Period  Weeks    Status  On-going      PT LONG TERM GOAL #4   Title  Pt will increase her LEFS score from 46 pts to > 66 pts in order to improve knee function  and decrease pain     Time  10    Period  Weeks    Status  On-going      PT LONG TERM GOAL #5   Title  Pt will demo improved gait mechanics : hip flexion, less supination, more DF/ eversion, wider BOS, less heel striking and report no knee pain on her regular walks in  order to maintain fitness routine     Time  8    Period  Weeks    Status  New      Additional Long Term Goals   Additional Long Term Goals  Yes      PT LONG TERM GOAL #6   Title  Pt will increase her L knee flexion, hip IR/ER strength from 3/5 to > 4/5 and demo increased fibula mobility on R leg in order to minimize R knee pain during sit to stand and stair navigation    Time  4    Period  Weeks    Status  New            Plan - 11/05/17 0816    Clinical Impression Statement  Pt is making progress with less prolapse sensations and has no difficulty with pelvic floor strengthening HEP. Focused on R knee pain as pt reported her knee pain has incresed since coming to PT and wearing shoe lift. Applied manual Tx which pt tolerated to mobilize fibula and medial cuneiform to improve DF/EV and PF/INV. Pt demo'd improve DF/EV during sit-to-stand with proer knee alignment post Tx. Pt reported less R knee pain post Tx. Plan to strengthen L knee flexion/ hip IR/ER strength at next session and progress pelvic floor strengthening. Pt required cues for increased hip flexion and demo'd improved more flexible feet with push off and stance.   Pt continues to benefit from skilled PT.       Rehab Potential  Good    PT Frequency  1x / week    PT Duration  12 weeks    PT Treatment/Interventions  Therapeutic activities;Neuromuscular re-education;Therapeutic exercise;Moist Heat;Electrical Stimulation;Balance training;Gait training;Energy conservation;Manual techniques;Scar mobilization;Taping;Patient/family education;Traction;Biofeedback;Aquatic Therapy    Consulted and Agree with Plan of Care  Patient       Patient will benefit from skilled therapeutic intervention in order to improve the following deficits and impairments:  Decreased activity  tolerance, Decreased coordination, Decreased scar mobility, Decreased range of motion, Decreased safety awareness, Decreased strength, Hypermobility, Pain, Postural  dysfunction, Decreased mobility, Decreased endurance, Improper body mechanics, Hypomobility, Increased muscle spasms, Impaired sensation, Increased fascial restricitons  Visit Diagnosis: Other symptoms and signs involving the musculoskeletal system  Unspecified lack of coordination  Muscle weakness (generalized)  H/O vaginal hysterectomy     Problem List Patient Active Problem List   Diagnosis Date Noted  . H/O vaginal hysterectomy     [name] ,PT, DPT, E-RYT  11/05/2017, 8:34 AM  [place] MAIN [facility] SERVICES [address] [place], [place], [address] Phone: [phone]   Fax:  [phone]  Name: Leimomi Zervas MRN: [id number] Date of Birth: 11/13/1950

## 2017-11-13 ENCOUNTER — Ambulatory Visit: Payer: Medicare Other | Admitting: Physical Therapy

## 2017-11-13 DIAGNOSIS — R29898 Other symptoms and signs involving the musculoskeletal system: Secondary | ICD-10-CM

## 2017-11-13 DIAGNOSIS — M6281 Muscle weakness (generalized): Secondary | ICD-10-CM

## 2017-11-13 DIAGNOSIS — Z90710 Acquired absence of both cervix and uterus: Secondary | ICD-10-CM

## 2017-11-13 DIAGNOSIS — R279 Unspecified lack of coordination: Secondary | ICD-10-CM

## 2017-11-13 NOTE — Therapy (Signed)
[place] MAIN [facility] SERVICES [address] [place], [place], [address] Phone: [phone]   Fax:  [phone]  Physical Therapy Treatment  Patient Details  Name: Kaitlyn Ponce MRN: [id number] Date of Birth: 1950-07-13 Referring Provider (PT): Leafy Ro    Encounter Date: 11/13/2017  PT End of Session - 11/13/17 1323    Visit Number  6    Number of Visits  12    Authorization Type  medicare     PT Start Time  1311    PT Stop Time  1400    PT Time Calculation (min)  49 min    Activity Tolerance  Patient tolerated treatment well;No increased pain    Behavior During Therapy  WFL for tasks assessed/performed       Past Medical History:  Diagnosis Date  . Allergy    cat, dust  . High cholesterol    taking medication, declined nutritional referral at this time 10/11/17  . History of tonsillectomy     Past Surgical History:  Procedure Laterality Date  . BREAST BIOPSY Left 2010   [place] core  . TUBAL LIGATION  1987  . VAGINAL HYSTERECTOMY  2002    There were no vitals filed for this visit.  Subjective Assessment - 11/13/17 1316    Subjective  Pt reports a 20% improvement with the R knee pain with out the shoe lift and after last Tx.  Pt has been able to resume her pelvic exercises without knee pain except for the figure 4 stretch. Pt has continued to do the same amount but her rest breaks are not has frequent.      Pertinent History  Umbilical Hernia for 1 year under monitoring, Gynecological Hx: large baby with 1st pregnancy which required stitches, vaginal hysterectomy, and tubal ligation. Pt is retired. Pt usually has her legs elevated on an ottoman when reading a book or sitting.  Walking daily 2 miles with some incline. Pt has a pool outdoors but will not be using it with the season change. Pt used the pool for walking earlier in the summer. Pt will take time to consider pool therapy for strengthening legs.                        Pelvic Floor Special Questions - 11/13/17 1355    Prolapse  Posterior Wall   within introitus. more cranial position post Tx   Pelvic Floor Internal Exam  pt consented verbally without contraindications     Exam Type  Vaginal    Palpation  perineal scar restrictions 7-5 o' clock 1-2nd layers , tightness at R ischioanal fossa     Strength  fair squeeze, definite lift    Strength # of reps  4    Strength # of seconds  2   limited cranial movement of posterior wall, withheld enduran       [facility] Adult PT Treatment/Exercise - 11/13/17 1356      Neuro Re-ed    Neuro Re-ed Details   cues for posterior pelvic floor co-activation, see pt instructions       Manual Therapy   Internal Pelvic Floor  STM/MWM fascial release with coordination of diaphragm to release perineal scar                   PT Long Term Goals - 11/04/17 1712      PT LONG TERM GOAL #1   Title  Pt will increase  her hip ext/ abd strength on her L from 2/5 to > 4+/5 in order to equalize her strength to R LE for improved knee pain and to navigate stairs     Time  8    Period  Weeks    Status  On-going      PT LONG TERM GOAL #2   Title  Pt will demo equal pelvic alignment of iliac crest across 2 visits and increase gait speed 1.3 m/s in order to walk longer distances     Time  12    Period  Weeks    Status  Revised      PT LONG TERM GOAL #3   Title  Pt will report decreased pain at the back from 6-7/10 to <3/10 and knee pain 5-6/10 to <3/10 after standing in one place while cooking for 45 min in order to cook for family      Time  4    Period  Weeks    Status  On-going      PT LONG TERM GOAL #4   Title  Pt will increase her LEFS score from 46 pts to > 66 pts in order to improve knee function  and decrease pain     Time  10    Period  Weeks    Status  On-going      PT LONG TERM GOAL #5   Title  Pt will demo improved gait mechanics : hip flexion, less supination, more DF/  eversion, wider BOS, less heel striking and report no knee pain on her regular walks in order to maintain fitness routine     Time  8    Period  Weeks    Status  New      Additional Long Term Goals   Additional Long Term Goals  Yes      PT LONG TERM GOAL #6   Title  Pt will increase her L knee flexion, hip IR/ER strength from 3/5 to > 4/5 and demo increased fibula mobility on R leg in order to minimize R knee pain during sit to stand and stair navigation    Time  4    Period  Weeks    Status  New            Plan - 11/13/17 1405    Clinical Impression Statement  Pt has been able to resume her pelvic exercises without knee pain since last session, indicating good carry over from last Tx. Withheld progression toward endurance pelvic floor strengthening due to poor cranial movement of posterior pelvic floor and fascial restrictions from perineal scar. Addressed scar restrictions with intra/ external manual Tx which pt toelrated without complaints. Pt continues to benefit from skilled PT to minimzie lowered position of pelvic organs.      Rehab Potential  Good    PT Frequency  1x / week    PT Duration  12 weeks    PT Treatment/Interventions  Therapeutic activities;Neuromuscular re-education;Therapeutic exercise;Moist Heat;Electrical Stimulation;Balance training;Gait training;Energy conservation;Manual techniques;Scar mobilization;Taping;Patient/family education;Traction;Biofeedback;Aquatic Therapy    Consulted and Agree with Plan of Care  Patient       Patient will benefit from skilled therapeutic intervention in order to improve the following deficits and impairments:  Decreased activity tolerance, Decreased coordination, Decreased scar mobility, Decreased range of motion, Decreased safety awareness, Decreased strength, Hypermobility, Pain, Postural dysfunction, Decreased mobility, Decreased endurance, Improper body mechanics, Hypomobility, Increased muscle spasms, Impaired sensation,  Increased fascial restricitons  Visit Diagnosis: Other symptoms  and signs involving the musculoskeletal system  Unspecified lack of coordination  Muscle weakness (generalized)  H/O vaginal hysterectomy     Problem List Patient Active Problem List   Diagnosis Date Noted  . H/O vaginal hysterectomy     [name] ,PT, DPT, E-RYT  11/13/2017, 2:08 PM  [place] MAIN [facility] SERVICES [address] [place], [place], [address] Phone: [phone]   Fax:  [phone]  Name: Kaitlyn Ponce MRN: [id number] Date of Birth: Dec 18, 1950

## 2017-11-13 NOTE — Patient Instructions (Signed)
Pillows under knees,  Bilateral figure-4 , sliding feet straight, knees bent   10 reps   ____  Continue with other exercises

## 2017-11-27 ENCOUNTER — Encounter: Payer: Medicare Other | Admitting: Physical Therapy

## 2017-12-05 ENCOUNTER — Ambulatory Visit: Payer: Medicare Other | Attending: Obstetrics and Gynecology | Admitting: Physical Therapy

## 2017-12-05 DIAGNOSIS — R279 Unspecified lack of coordination: Secondary | ICD-10-CM | POA: Diagnosis present

## 2017-12-05 DIAGNOSIS — R29898 Other symptoms and signs involving the musculoskeletal system: Secondary | ICD-10-CM | POA: Insufficient documentation

## 2017-12-05 DIAGNOSIS — M6281 Muscle weakness (generalized): Secondary | ICD-10-CM | POA: Insufficient documentation

## 2017-12-06 NOTE — Therapy (Addendum)
[place] MAIN [facility] SERVICES [address] [place], [place], [address] Phone: [phone]   Fax:  [phone]  Physical Therapy Treatment / Discharge Note  Patient Details  Name: Kaitlyn Ponce MRN: [id number] Date of Birth: [birth year] Referring Provider (PT): Leafy Ro    Encounter Date: 12/05/2017  PT End of Session - 12/06/17 1458    Visit Number  7    Number of Visits  12    Authorization Type  medicare     PT Start Time  9826    PT Stop Time  1330    PT Time Calculation (min)  26 min    Activity Tolerance  Patient tolerated treatment well;No increased pain    Behavior During Therapy  WFL for tasks assessed/performed       Past Medical History:  Diagnosis Date  . Allergy    cat, dust  . High cholesterol    taking medication, declined nutritional referral at this time 10/11/17  . History of tonsillectomy     Past Surgical History:  Procedure Laterality Date  . BREAST BIOPSY Left 2010   [place] core  . TUBAL LIGATION  1987  . VAGINAL HYSTERECTOMY  2002    There were no vitals filed for this visit.  Subjective Assessment - 12/05/17 1311    Subjective  Pt reports she no longer needs to take Alleve as often for her R knee pain. Pt also has been walking and travelliing and still has not noticed the falling out feeling in her pelvic area.      Pertinent History  Umbilical Hernia for 1 year under monitoring, Gynecological Hx: large baby with 1st pregnancy which required stitches, vaginal hysterectomy, and tubal ligation. Pt is retired. Pt usually has her legs elevated on an ottoman when reading a book or sitting.  Walking daily 2 miles with some incline. Pt has a pool outdoors but will not be using it with the season change. Pt used the pool for walking earlier in the summer. Pt will take time to consider pool therapy for strengthening legs.                          [place] Adult PT Treatment/Exercise - 12/06/17 1459       Therapeutic Activites    Therapeutic Activities  --   reassessed goals, / discussed d/c                 PT Long Term Goals - 12/05/17 1312      PT LONG TERM GOAL #1   Title  Pt will increase her hip ext/ abd strength on her L from 2/5 to > 4+/5 in order to equalize her strength to R LE for improved knee pain and to navigate stairs  ( 11/14: 4-/5)     Time  8    Period  Weeks    Status  Partially Met      PT LONG TERM GOAL #2   Title  Pt will demo equal pelvic alignment of iliac crest across 2 visits and increase gait speed 1.3 m/s in order to walk longer distances  ( 11/14: 1.3 m/s)     Time  12    Period  Weeks    Status  Achieved      PT LONG TERM GOAL #3   Title  Pt will report decreased pain at the back from 6-7/10 to <3/10 and knee pain 5-6/10 to <3/10  after standing in one place while cooking for 45 min in order to cook for family      Time  4    Period  Weeks    Status  Achieved      PT LONG TERM GOAL #4   Title  Pt will increase her LEFS score from 46 pts to > 66 pts in order to improve knee function  and decrease pain  ( 11/14: 73%)     Time  10    Period  Weeks    Status  Achieved      PT LONG TERM GOAL #5   Title  Pt will demo improved gait mechanics : hip flexion, less supination, more DF/ eversion, wider BOS, less heel striking and report no knee pain on her regular walks in order to maintain fitness routine     Time  8    Period  Weeks    Status  Achieved      PT LONG TERM GOAL #6   Title  Pt will increase her L knee flexion, hip IR/ER strength from 3/5 to > 4/5 and demo increased fibula mobility on R leg in order to minimize R knee pain during sit to stand and stair navigation    Time  4    Period  Weeks    Status  Achieved            Plan - 12/05/17 1323    Clinical Impression Statement Pt has achieved 5/6 goals and is close to meeting her remaining goal. Pt has demo'd proper pelvic alignment, increased deep core coordination and  strength, decreased mm imbalance in BLE, improved gait mechanics. These improvements have helped to improve her prolapse and R knee pain symptoms. Pt is ready for d/c at this time.  Pt reports she feels a "A Very Deal Better" since starting PT.     Rehab Potential  Good    PT Frequency  1x / week    PT Duration  12 weeks    PT Treatment/Interventions  Therapeutic activities;Neuromuscular re-education;Therapeutic exercise;Moist Heat;Electrical Stimulation;Balance training;Gait training;Energy conservation;Manual techniques;Scar mobilization;Taping;Patient/family education;Traction;Biofeedback;Aquatic Therapy    Consulted and Agree with Plan of Care  Patient       Patient will benefit from skilled therapeutic intervention in order to improve the following deficits and impairments:  Decreased activity tolerance, Decreased coordination, Decreased scar mobility, Decreased range of motion, Decreased safety awareness, Decreased strength, Hypermobility, Pain, Postural dysfunction, Decreased mobility, Decreased endurance, Improper body mechanics, Hypomobility, Increased muscle spasms, Impaired sensation, Increased fascial restricitons  Visit Diagnosis: Other symptoms and signs involving the musculoskeletal system  Unspecified lack of coordination  Muscle weakness (generalized)     Problem List Patient Active Problem List   Diagnosis Date Noted  . H/O vaginal hysterectomy     Kaitlyn Ponce ,PT, DPT, E-RYT  12/06/2017, 3:01 PM  [place] MAIN [facility] SERVICES [address] [place], [place], [address] Phone: [phone]   Fax:  [phone]  Name: Kaitlyn Ponce MRN: [id number] Date of Birth: 1950/10/10

## 2017-12-23 ENCOUNTER — Encounter: Payer: Medicare Other | Admitting: Physical Therapy

## 2018-01-09 ENCOUNTER — Encounter: Payer: Medicare Other | Admitting: Physical Therapy

## 2018-02-17 ENCOUNTER — Ambulatory Visit: Payer: Self-pay | Admitting: Orthopedic Surgery

## 2018-03-21 ENCOUNTER — Other Ambulatory Visit: Payer: Self-pay | Admitting: Family Medicine

## 2018-03-21 DIAGNOSIS — R221 Localized swelling, mass and lump, neck: Secondary | ICD-10-CM

## 2018-03-21 DIAGNOSIS — R599 Enlarged lymph nodes, unspecified: Secondary | ICD-10-CM

## 2018-03-27 ENCOUNTER — Ambulatory Visit
Admission: RE | Admit: 2018-03-27 | Discharge: 2018-03-27 | Disposition: A | Discharge status: Home / Self Care | Payer: Medicare Other | Source: Ambulatory Visit | Attending: Family Medicine | Admitting: Family Medicine

## 2018-03-27 ENCOUNTER — Other Ambulatory Visit: Payer: Self-pay

## 2018-03-27 DIAGNOSIS — R599 Enlarged lymph nodes, unspecified: Secondary | ICD-10-CM | POA: Diagnosis present

## 2018-03-27 DIAGNOSIS — R221 Localized swelling, mass and lump, neck: Secondary | ICD-10-CM | POA: Diagnosis not present

## 2018-04-09 ENCOUNTER — Encounter
Admission: RE | Admit: 2018-04-09 | Discharge: 2018-04-09 | Disposition: A | Discharge status: Home / Self Care | Payer: Medicare Other | Source: Ambulatory Visit | Attending: Orthopedic Surgery | Admitting: Orthopedic Surgery

## 2018-04-09 ENCOUNTER — Other Ambulatory Visit: Payer: Self-pay

## 2018-04-09 DIAGNOSIS — Z01818 Encounter for other preprocedural examination: Secondary | ICD-10-CM | POA: Insufficient documentation

## 2018-04-09 HISTORY — DX: Nausea with vomiting, unspecified: R11.2

## 2018-04-09 HISTORY — DX: Other specified postprocedural states: Z98.890

## 2018-04-09 LAB — SURGICAL PCR SCREEN
MRSA, PCR: NEGATIVE
STAPHYLOCOCCUS AUREUS: NEGATIVE

## 2018-04-09 LAB — BASIC METABOLIC PANEL
ANION GAP: 8 (ref 5–15)
BUN: 15 mg/dL (ref 8–23)
CALCIUM: 9.1 mg/dL (ref 8.9–10.3)
CHLORIDE: 107 mmol/L (ref 98–111)
CO2: 28 mmol/L (ref 22–32)
Creatinine, Ser: 0.94 mg/dL (ref 0.44–1.00)
GFR calc Af Amer: >60 mL/min (ref >60)
Glucose, Bld: 93 mg/dL (ref 70–99)
POTASSIUM: 4.1 mmol/L (ref 3.5–5.1)
Sodium: 143 mmol/L (ref 135–145)

## 2018-04-09 LAB — URINALYSIS, ROUTINE W REFLEX MICROSCOPIC
Bilirubin Urine: NEGATIVE
GLUCOSE, UA: NEGATIVE mg/dL
Hgb urine dipstick: NEGATIVE
KETONES UR: NEGATIVE mg/dL
LEUKOCYTE UA: NEGATIVE
NITRITE: NEGATIVE
PH: 7 (ref 5.0–8.0)
PROTEIN: NEGATIVE mg/dL
Specific Gravity, Urine: 1.01 (ref 1.005–1.030)

## 2018-04-09 LAB — CBC
HCT: 46.1 % — ABNORMAL HIGH (ref 36.0–46.0)
HEMOGLOBIN: 15.3 g/dL — AB (ref 12.0–15.0)
MCH: 30.2 pg (ref 26.0–34.0)
MCHC: 33.2 g/dL (ref 30.0–36.0)
MCV: 90.9 fL (ref 80.0–100.0)
Platelets: 191 10*3/uL (ref 150–400)
RBC: 5.07 MIL/uL (ref 3.87–5.11)
RDW: 13.2 % (ref 11.5–15.5)
WBC: 7.4 10*3/uL (ref 4.0–10.5)
nRBC: 0 % (ref 0.0–0.2)

## 2018-04-09 LAB — PROTIME-INR
INR: 0.9 (ref 0.8–1.2)
PROTHROMBIN TIME: 12.2 s (ref 11.4–15.2)

## 2018-04-09 LAB — APTT: aPTT: 31 seconds (ref 24–36)

## 2018-04-09 NOTE — Patient Instructions (Addendum)
Your procedure is scheduled on: Wednesday 04/23/18.  Report to DAY SURGERY DEPARTMENT LOCATED ON 2ND FLOOR MEDICAL MALL ENTRANCE. To find out your arrival time please call [phone] between 1PM - 3PM on Tuesday 04/22/18.  **IF YOUR SURGERY GETS POSTPONED, YOUR SURGEON'S OFFICE WILL CALL YOU WITH A NEW SURGERY DATE AND INSTRUCTIONS ABOUT WHEN TO CALL TO FIND OUT YOUR ARRIVAL TIME.   Remember: Instructions that are not followed completely may result in serious medical risk, up to and including death, or upon the discretion of your surgeon and anesthesiologist your surgery may need to be rescheduled.      _X__ 1. Do not eat food after midnight the night before your procedure.                 No gum chewing or hard candies. You may drink clear liquids up to 2 hours                  before you are scheduled to arrive for your surgery- DO NOT drink clear                 liquids within 2 hours of the start of your surgery.                 Clear Liquids include:  water, apple juice without pulp, clear carbohydrate                 drink such as Clearfast or Gatorade, Black Coffee or Tea (Do not add                 milk or creamer to coffee or tea).   __X__2.  On the morning of surgery brush your teeth with toothpaste and water, you may rinse your mouth with mouthwash if you wish.  Do not swallow any toothpaste or mouthwash.     __X__3.  Notify your doctor if there is any change in your medical condition      (cold, fever, infections).     Do not wear jewelry, make-up, hairpins, clips or nail polish. Do not wear lotions, powders, or perfumes.  Do not shave 48 hours prior to surgery. Men may shave face and neck. Do not bring valuables to the hospital.    [facility] is not responsible for any belongings or valuables.  Contacts, dentures/partials or body piercings may not be worn into surgery. Bring a case for your contacts, glasses or hearing aids, a denture cup will be supplied.   Leave  your suitcase in the car. After surgery it may be brought to your room.   For patients admitted to the hospital, discharge time is determined by your treatment team.      Please read over the following fact sheets that you were given:   MRSA Information   __X__ Take these medicines the morning of surgery with A SIP OF WATER:     1. pravastatin (PRAVACHOL) 20 MG tablet     __X__ Use CHG Soap as directed: Use the night before your surgery and the morning of.   __X__ Stop Anti-inflammatories 7 days before surgery such as Advil, Ibuprofen, Motrin, BC or Goodies Powder, Naprosyn, Naproxen, Aleve, Aspirin, Meloxicam. May take Tylenol if needed for pain or discomfort.    __X__ Please do not start any new herbal supplements before your procedure.

## 2018-04-23 ENCOUNTER — Ambulatory Visit: Admission: RE | Admit: 2018-04-23 | Payer: Medicare Other | Source: Home / Self Care | Admitting: Orthopedic Surgery

## 2018-04-23 ENCOUNTER — Encounter: Admission: RE | Payer: Self-pay | Source: Home / Self Care

## 2018-04-23 SURGERY — ARTHROPLASTY, KNEE, TOTAL
Anesthesia: Choice | Laterality: Right

## 2018-05-15 ENCOUNTER — Other Ambulatory Visit: Payer: Self-pay | Admitting: Otolaryngology

## 2018-05-15 ENCOUNTER — Ambulatory Visit
Admission: RE | Admit: 2018-05-15 | Discharge: 2018-05-15 | Disposition: A | Discharge status: Home / Self Care | Payer: Medicare Other | Source: Ambulatory Visit | Attending: Otolaryngology | Admitting: Otolaryngology

## 2018-05-15 ENCOUNTER — Other Ambulatory Visit: Payer: Self-pay

## 2018-05-15 DIAGNOSIS — R221 Localized swelling, mass and lump, neck: Secondary | ICD-10-CM

## 2018-05-15 MED ORDER — IOHEXOL 300 MG/ML  SOLN
75.0000 mL | Freq: Once | INTRAMUSCULAR | Status: AC | PRN
  Administered 2018-05-15: 75 mL via INTRAVENOUS

## 2018-05-23 ENCOUNTER — Other Ambulatory Visit: Payer: Self-pay | Admitting: Otolaryngology

## 2018-05-23 DIAGNOSIS — K118 Other diseases of salivary glands: Secondary | ICD-10-CM

## 2018-05-27 ENCOUNTER — Other Ambulatory Visit: Payer: Self-pay | Admitting: Student

## 2018-05-28 ENCOUNTER — Other Ambulatory Visit: Payer: Self-pay

## 2018-05-28 ENCOUNTER — Ambulatory Visit
Admission: RE | Admit: 2018-05-28 | Discharge: 2018-05-28 | Disposition: A | Discharge status: Home / Self Care | Payer: Medicare Other | Source: Ambulatory Visit | Attending: Otolaryngology | Admitting: Otolaryngology

## 2018-05-28 ENCOUNTER — Other Ambulatory Visit: Payer: Self-pay | Admitting: Otolaryngology

## 2018-05-28 DIAGNOSIS — Z79899 Other long term (current) drug therapy: Secondary | ICD-10-CM | POA: Insufficient documentation

## 2018-05-28 DIAGNOSIS — K118 Other diseases of salivary glands: Secondary | ICD-10-CM

## 2018-05-28 DIAGNOSIS — Z882 Allergy status to sulfonamides status: Secondary | ICD-10-CM | POA: Insufficient documentation

## 2018-05-28 DIAGNOSIS — E7800 Pure hypercholesterolemia, unspecified: Secondary | ICD-10-CM | POA: Insufficient documentation

## 2018-05-28 DIAGNOSIS — D37030 Neoplasm of uncertain behavior of the parotid salivary glands: Secondary | ICD-10-CM | POA: Insufficient documentation

## 2018-05-28 MED ORDER — SODIUM CHLORIDE 0.9 % IV SOLN
INTRAVENOUS | Status: DC
  Administered 2018-05-28: 10:00:00 via INTRAVENOUS

## 2018-05-28 MED ORDER — MIDAZOLAM HCL 2 MG/2ML IJ SOLN
INTRAMUSCULAR | Status: AC | PRN
  Administered 2018-05-28: 0.5 mg via INTRAVENOUS
  Administered 2018-05-28: 1 mg via INTRAVENOUS

## 2018-05-28 MED ORDER — FENTANYL CITRATE (PF) 100 MCG/2ML IJ SOLN
INTRAMUSCULAR | Status: AC
  Filled 2018-05-28: qty 4

## 2018-05-28 MED ORDER — FENTANYL CITRATE (PF) 100 MCG/2ML IJ SOLN
INTRAMUSCULAR | Status: AC | PRN
  Administered 2018-05-28: 50 ug via INTRAVENOUS
  Administered 2018-05-28: 25 ug via INTRAVENOUS

## 2018-05-28 MED ORDER — MIDAZOLAM HCL 2 MG/2ML IJ SOLN
INTRAMUSCULAR | Status: AC
  Filled 2018-05-28: qty 4

## 2018-05-28 NOTE — Procedures (Signed)
Interventional Radiology Procedure Note  Procedure: US Guided Biopsy of right parotid mass  Complications: None  Estimated Blood Loss: < 10 mL  Findings: 25 G FNA x 3 and 18 G core biopsy x 3 of right parotid mass performed under US guidance.    Venetia Night. Kathlene Cote, M.D Pager:  [phone]

## 2018-05-28 NOTE — Progress Notes (Signed)
Tolerated procedure well.  D/C instructions reviewed with patient with husband via telephone.

## 2018-05-28 NOTE — H&P (Signed)
Chief Complaint: Patient was seen in consultation today for biopsy of right parotid mass at the request of [place]  Referring Physician(s): Vaught,Creighton  Patient Status: [facility] - Out-pt  History of Present Illness: Kaitlyn Ponce is a 68 y.o. female with a palpable and enlarging right sided mass behind ear lobe that appears to be originating in the right parotid gland by CT. Lesion is painless and patient is asymptomatic.  Past Medical History:  Diagnosis Date  . Allergy    cat, dust  . High cholesterol    taking medication, declined nutritional referral at this time 10/11/17  . History of tonsillectomy   . PONV (postoperative nausea and vomiting)     Past Surgical History:  Procedure Laterality Date  . BREAST BIOPSY Left 2010   [place] core  . TONSILLECTOMY    . TUBAL LIGATION  1987  . VAGINAL HYSTERECTOMY  2002    Allergies: Sulfa antibiotics  Medications: Prior to Admission medications   Medication Sig Start Date End Date Taking? Authorizing Provider  Iodoquinol-HC-Aloe Polysacch (ALCORTIN A) 1-2-1 % GEL Apply topically as needed.   Yes [provider]  pravastatin (PRAVACHOL) 20 MG tablet Take 20 mg by mouth daily.   Yes [provider]  Naproxen Sodium (ALEVE) 220 MG CAPS Take 220 mg by mouth daily as needed (pain).     [provider]     Family History  Problem Relation Age of Onset  . Breast cancer Neg Hx     Social History   Socioeconomic History  . Marital status: Married    Spouse name: Not on file  . Number of children: Not on file  . Years of education: Not on file  . Highest education level: Not on file  Occupational History  . Not on file  Social Needs  . Financial resource strain: Not hard at all  . Food insecurity:    Worry: Never true    Inability: Never true  . Transportation needs:    Medical: Yes    Non-medical: Yes  Tobacco Use  . Smoking status: Never Smoker  . Smokeless tobacco:  Never Used  Substance and Sexual Activity  . Alcohol use: Not Currently    Frequency: Never  . Drug use: Not Currently  . Sexual activity: Yes    Birth control/protection: Surgical  Lifestyle  . Physical activity:    Days per week: Not on file    Minutes per session: Not on file  . Stress: Not at all  Relationships  . Social connections:    Talks on phone: More than three times a week    Gets together: Not on file    Attends religious service: Not on file    Active member of club or organization: Not on file    Attends meetings of clubs or organizations: Not on file    Relationship status: Not on file  Other Topics Concern  . Not on file  Social History Narrative  . Not on file    Review of Systems: A 12 point ROS discussed and pertinent positives are indicated in the HPI above.  All other systems are negative.  Review of Systems  Constitutional: Negative.   HENT:       Palpable mass posterior to right ear lobe  Respiratory: Negative.   Cardiovascular: Negative.   Gastrointestinal: Negative.   Genitourinary: Negative.   Musculoskeletal: Negative.   Neurological: Negative.     Vital Signs: BP 128/61   Pulse 74  Temp 98.7 F (37.1 C) (Oral)   Resp 15   Ht 5\' 2"  (1.575 m)   Wt 108.9 kg   SpO2 98%   BMI 43.90 kg/m   Physical Exam Vitals signs reviewed.  Constitutional:      General: She is not in acute distress.    Appearance: Normal appearance. She is not ill-appearing, toxic-appearing or diaphoretic.  HENT:     Head: Normocephalic and atraumatic.     Mouth/Throat:     Mouth: Mucous membranes are moist.     Pharynx: Oropharynx is clear.  Neck:     Comments: Palpable firm nodule of right superficial parotid region near right ear. Cardiovascular:     Rate and Rhythm: Normal rate and regular rhythm.     Heart sounds: Normal heart sounds. No murmur. No friction rub. No gallop.   Pulmonary:     Effort: Pulmonary effort is normal. No respiratory distress.      Breath sounds: No stridor. No wheezing, rhonchi or rales.  Abdominal:     General: There is no distension.     Palpations: Abdomen is soft.     Tenderness: There is no abdominal tenderness.  Musculoskeletal:        General: No swelling.  Skin:    General: Skin is warm and dry.  Neurological:     Mental Status: She is alert and oriented to person, place, and time.     Imaging: Ct Soft Tissue Neck W Contrast  Result Date: 05/15/2018 CLINICAL DATA:  Right neck mass for 8 months which is enlarging. EXAM: CT NECK WITH CONTRAST TECHNIQUE: Multidetector CT imaging of the neck was performed using the standard protocol following the bolus administration of intravenous contrast. CONTRAST:  57mL OMNIPAQUE IOHEXOL 300 MG/ML  SOLN COMPARISON:  Neck ultrasound 03/27/2018 FINDINGS: Pharynx and larynx: No evidence of mass or swelling. Patent airway. No retropharyngeal fluid. Salivary glands: 2.0 x 1.8 cm homogeneous, solid-appearing mass in the superficial lobe of the right parotid gland corresponding to the mass on ultrasound. Unremarkable left parotid and bilateral submandibular glands. Thyroid: Unremarkable. Lymph nodes: No enlarged or suspicious lymph nodes in the neck. Vascular: Major vascular structures of the neck are patent. Limited intracranial: Unremarkable. Visualized orbits: Unremarkable. Mastoids and visualized paranasal sinuses: Clear. Skeleton: No suspicious osseous lesion. Hemangioma in the C6 vertebral body. Moderate cervical disc degeneration. Upper chest: Clear lung apices. Other: None. IMPRESSION: 2 cm right parotid mass which may reflect a primary parotid neoplasm or enlarged lymph node. Electronically Signed   By: Logan Bores M.D.   On: 05/15/2018 12:53    Labs:  CBC: Recent Labs    04/09/18 1041  WBC 7.4  HGB 15.3*  HCT 46.1*  PLT 191    COAGS: Recent Labs    04/09/18 1041  INR 0.9  APTT 31    BMP: Recent Labs    04/09/18 1041  NA 143  K 4.1  CL 107  CO2 28   GLUCOSE 93  BUN 15  CALCIUM 9.1  CREATININE 0.94  GFRNONAA >60  GFRAA >60    Assessment and Plan:  For US guided FNA/possible core biopsy of right parotid nodule/mass today. Risks and benefits of right parotid lesion biopsy was discussed with the patient and/or patient's family including, but not limited to bleeding, infection, damage to adjacent structures or low yield requiring additional tests. All of the questions were answered and there is agreement to proceed. Consent signed and in chart.  Thank you for this interesting  consult.  I greatly enjoyed meeting Kaitlyn Ponce and look forward to participating in their care.  A copy of this report was sent to the requesting provider on this date.  Electronically Signed: Azzie Roup, MD 05/28/2018, 10:24 AM     I spent a total of 30 Minutes in face to face in clinical consultation, greater than 50% of which was counseling/coordinating care for right parotid mass biopsy.

## 2018-05-30 LAB — CYTOLOGY - NON PAP

## 2018-05-30 LAB — SURGICAL PATHOLOGY

## 2018-06-24 ENCOUNTER — Encounter
Admission: RE | Admit: 2018-06-24 | Discharge: 2018-06-24 | Disposition: A | Discharge status: Home / Self Care | Payer: Medicare Other | Source: Ambulatory Visit | Attending: Otolaryngology | Admitting: Otolaryngology

## 2018-06-24 ENCOUNTER — Other Ambulatory Visit: Payer: Self-pay

## 2018-06-24 ENCOUNTER — Encounter: Payer: Self-pay | Admitting: *Deleted

## 2018-06-24 NOTE — Patient Instructions (Signed)
Your procedure is scheduled on: 07/01/2018 Tuesday Report to Same Day Surgery 2nd floor medical mall [facility] Entrance-take elevator on left to 2nd floor.  Check in with surgery information desk.) To find out your arrival time please call [phone] between 1PM - 3PM on 06/30/2018  Remember: Instructions that are not followed completely may result in serious medical risk, up to and including death, or upon the discretion of your surgeon and anesthesiologist your surgery may need to be rescheduled.    _x___ 1. Do not eat food after midnight the night before your procedure. You may drink clear liquids up to 2 hours before you are scheduled to arrive at the hospital for your procedure.  Do not drink clear liquids within 2 hours of your scheduled arrival to the hospital.  Clear liquids include  --Water or Apple juice without pulp  --Clear carbohydrate beverage such as ClearFast or Gatorade  --Black Coffee or Clear Tea (No milk, no creamers, do not add anything to                  the coffee or Tea Type 1 and type 2 diabetics should only drink water.   ____Ensure clear carbohydrate drink on the way to the hospital for bariatric patients  ____Ensure clear carbohydrate drink 3 hours before surgery for Dr Dwyane Luo patients if physician instructed.   No gum chewing or hard candies.     __x__ 2. No Alcohol for 24 hours before or after surgery.   __x__3. No Smoking or e-cigarettes for 24 prior to surgery.  Do not use any chewable tobacco products for at least 6 hour prior to surgery   ____  4. Bring all medications with you on the day of surgery if instructed.    __x__ 5. Notify your doctor if there is any change in your medical condition     (cold, fever, infections).    x___6. On the morning of surgery brush your teeth with toothpaste and water.  You may rinse your mouth with mouth wash if you wish.  Do not swallow any toothpaste or mouthwash.   Do not wear jewelry, make-up, hairpins,  clips or nail polish.  Do not wear lotions, powders, or perfumes. You may wear deodorant.  Do not shave 48 hours prior to surgery. Men may shave face and neck.  Do not bring valuables to the hospital.    [facility] is not responsible for any belongings or valuables.               Contacts, dentures or bridgework may not be worn into surgery.  Leave your suitcase in the car. After surgery it may be brought to your room.  For patients admitted to the hospital, discharge time is determined by your                       treatment team.  _  Patients discharged the day of surgery will not be allowed to drive home.  You will need someone to drive you home and stay with you the night of your procedure.    Please read over the following fact sheets that you were given:   [facility] Preparing for Surgery and or MRSA Information   _x___ Take anti-hypertensive listed below, cardiac, seizure, asthma,     anti-reflux and psychiatric medicines. These include:  1. None  2.  3.  4.  5.  6.  ____Fleets enema or Magnesium Citrate as directed.  _x___ Use CHG Soap or sage wipes as directed on instruction sheet   ____ Use inhalers on the day of surgery and bring to hospital day of surgery  ____ Stop Metformin and Janumet 2 days prior to surgery.    ____ Take 1/2 of usual insulin dose the night before surgery and none on the morning     surgery.   _x___ Follow recommendations from Cardiologist, Pulmonologist or PCP regarding          stopping Aspirin, Coumadin, Plavix ,Eliquis, Effient, or Pradaxa, and Pletal.  X____Stop Anti-inflammatories such as Advil, Aleve, Ibuprofen, Motrin, Naproxen, Naprosyn, Goodies powders or aspirin products. OK to take Tylenol and                          Celebrex.   _x___ Stop supplements until after surgery.  But may continue Vitamin D, Vitamin B,       and multivitamin.   ____ Bring C-Pap to the hospital.

## 2018-06-24 NOTE — Patient Instructions (Signed)
Your procedure is scheduled on: 07/01/2018 Tues Report to Same Day Surgery 2nd floor medical mall [facility] Entrance-take elevator on left to 2nd floor.  Check in with surgery information desk.) To find out your arrival time please call [phone] between 1PM - 3PM on 06/30/2018 Mon  Remember: Instructions that are not followed completely may result in serious medical risk, up to and including death, or upon the discretion of your surgeon and anesthesiologist your surgery may need to be rescheduled.    _x___ 1. Do not eat food after midnight the night before your procedure. You may drink clear liquids up to 2 hours before you are scheduled to arrive at the hospital for your procedure.  Do not drink clear liquids within 2 hours of your scheduled arrival to the hospital.  Clear liquids include  --Water or Apple juice without pulp  --Clear carbohydrate beverage such as ClearFast or Gatorade  --Black Coffee or Clear Tea (No milk, no creamers, do not add anything to                  the coffee or Tea Type 1 and type 2 diabetics should only drink water.   ____Ensure clear carbohydrate drink on the way to the hospital for bariatric patients  ____Ensure clear carbohydrate drink 3 hours before surgery for Dr Dwyane Luo patients if physician instructed.   No gum chewing or hard candies.     __x__ 2. No Alcohol for 24 hours before or after surgery.   __x__3. No Smoking or e-cigarettes for 24 prior to surgery.  Do not use any chewable tobacco products for at least 6 hour prior to surgery   ____  4. Bring all medications with you on the day of surgery if instructed.    __x__ 5. Notify your doctor if there is any change in your medical condition     (cold, fever, infections).    x___6. On the morning of surgery brush your teeth with toothpaste and water.  You may rinse your mouth with mouth wash if you wish.  Do not swallow any toothpaste or mouthwash.   Do not wear jewelry, make-up, hairpins,  clips or nail polish.  Do not wear lotions, powders, or perfumes. You may wear deodorant.  Do not shave 48 hours prior to surgery. Men may shave face and neck.  Do not bring valuables to the hospital.    [facility] is not responsible for any belongings or valuables.               Contacts, dentures or bridgework may not be worn into surgery.  Leave your suitcase in the car. After surgery it may be brought to your room.  For patients admitted to the hospital, discharge time is determined by your                       treatment team.  _  Patients discharged the day of surgery will not be allowed to drive home.  You will need someone to drive you home and stay with you the night of your procedure.    Please read over the following fact sheets that you were given:   [facility] Preparing for Surgery and or MRSA Information   _x___ Take anti-hypertensive listed below, cardiac, seizure, asthma,     anti-reflux and psychiatric medicines. These include:  1. None  2.  3.  4.  5.  6.  ____Fleets enema or Magnesium Citrate as directed.  _x___ Use CHG Soap or sage wipes as directed on instruction sheet   ____ Use inhalers on the day of surgery and bring to hospital day of surgery  ____ Stop Metformin and Janumet 2 days prior to surgery.    ____ Take 1/2 of usual insulin dose the night before surgery and none on the morning     surgery.   _x___ Follow recommendations from Cardiologist, Pulmonologist or PCP regarding          stopping Aspirin, Coumadin, Plavix ,Eliquis, Effient, or Pradaxa, and Pletal.  X____Stop Anti-inflammatories such as Advil, Aleve, Ibuprofen, Motrin, Naproxen, Naprosyn, Goodies powders or aspirin products. OK to take Tylenol and                          Celebrex.   _x___ Stop supplements until after surgery.  But may continue Vitamin D, Vitamin B,       and multivitamin.   ____ Bring C-Pap to the hospital.

## 2018-06-27 ENCOUNTER — Other Ambulatory Visit: Payer: Self-pay | Admitting: Otolaryngology

## 2018-06-27 ENCOUNTER — Other Ambulatory Visit
Admission: RE | Admit: 2018-06-27 | Discharge: 2018-06-27 | Disposition: A | Discharge status: Home / Self Care | Payer: Medicare Other | Source: Ambulatory Visit | Attending: Otolaryngology | Admitting: Otolaryngology

## 2018-06-27 ENCOUNTER — Other Ambulatory Visit: Payer: Self-pay

## 2018-06-27 DIAGNOSIS — Z1159 Encounter for screening for other viral diseases: Secondary | ICD-10-CM | POA: Insufficient documentation

## 2018-06-27 DIAGNOSIS — Z01812 Encounter for preprocedural laboratory examination: Secondary | ICD-10-CM | POA: Insufficient documentation

## 2018-06-27 DIAGNOSIS — D37030 Neoplasm of uncertain behavior of the parotid salivary glands: Secondary | ICD-10-CM

## 2018-06-28 LAB — NOVEL CORONAVIRUS, NAA (HOSP ORDER, SEND-OUT TO REF LAB; TAT 18-24 HRS): SARS-CoV-2, NAA: NOT DETECTED

## 2018-07-01 ENCOUNTER — Ambulatory Visit: Payer: Medicare Other | Admitting: Anesthesiology

## 2018-07-01 ENCOUNTER — Other Ambulatory Visit: Payer: Self-pay

## 2018-07-01 ENCOUNTER — Observation Stay
Admission: RE | Admit: 2018-07-01 | Discharge: 2018-07-02 | Disposition: A | Discharge status: Home / Self Care | Payer: Medicare Other | Attending: Otolaryngology | Admitting: Otolaryngology

## 2018-07-01 ENCOUNTER — Encounter: Payer: Self-pay | Admitting: *Deleted

## 2018-07-01 ENCOUNTER — Encounter
Admission: RE | Disposition: A | Discharge status: Home / Self Care | Payer: Self-pay | Source: Home / Self Care | Attending: Otolaryngology

## 2018-07-01 DIAGNOSIS — Z882 Allergy status to sulfonamides status: Secondary | ICD-10-CM | POA: Insufficient documentation

## 2018-07-01 DIAGNOSIS — Z825 Family history of asthma and other chronic lower respiratory diseases: Secondary | ICD-10-CM | POA: Insufficient documentation

## 2018-07-01 DIAGNOSIS — Z9889 Other specified postprocedural states: Secondary | ICD-10-CM

## 2018-07-01 DIAGNOSIS — Z8249 Family history of ischemic heart disease and other diseases of the circulatory system: Secondary | ICD-10-CM | POA: Diagnosis not present

## 2018-07-01 DIAGNOSIS — Z79899 Other long term (current) drug therapy: Secondary | ICD-10-CM | POA: Insufficient documentation

## 2018-07-01 DIAGNOSIS — K219 Gastro-esophageal reflux disease without esophagitis: Secondary | ICD-10-CM | POA: Insufficient documentation

## 2018-07-01 DIAGNOSIS — Z833 Family history of diabetes mellitus: Secondary | ICD-10-CM | POA: Insufficient documentation

## 2018-07-01 DIAGNOSIS — D110 Benign neoplasm of parotid gland: Principal | ICD-10-CM | POA: Insufficient documentation

## 2018-07-01 DIAGNOSIS — Z90710 Acquired absence of both cervix and uterus: Secondary | ICD-10-CM | POA: Insufficient documentation

## 2018-07-01 DIAGNOSIS — Z8261 Family history of arthritis: Secondary | ICD-10-CM | POA: Diagnosis not present

## 2018-07-01 DIAGNOSIS — D37030 Neoplasm of uncertain behavior of the parotid salivary glands: Secondary | ICD-10-CM | POA: Diagnosis present

## 2018-07-01 DIAGNOSIS — Z800 Family history of malignant neoplasm of digestive organs: Secondary | ICD-10-CM | POA: Insufficient documentation

## 2018-07-01 HISTORY — DX: Gastro-esophageal reflux disease without esophagitis: K21.9

## 2018-07-01 HISTORY — PX: PAROTIDECTOMY: SHX2163

## 2018-07-01 SURGERY — EXCISION, PAROTID GLAND
Anesthesia: General | Site: Neck

## 2018-07-01 MED ORDER — DEXAMETHASONE SODIUM PHOSPHATE 10 MG/ML IJ SOLN
INTRAMUSCULAR | Status: AC
  Filled 2018-07-01: qty 1

## 2018-07-01 MED ORDER — CALCIUM CARBONATE ANTACID 500 MG PO CHEW: 1.0000 | CHEWABLE_TABLET | Freq: Every evening | ORAL | Status: DC | PRN

## 2018-07-01 MED ORDER — ALCORTIN A 1-2-1 % EX GEL: 1.0000 "application " | Freq: Two times a day (BID) | CUTANEOUS | Status: DC | PRN

## 2018-07-01 MED ORDER — DEXAMETHASONE SODIUM PHOSPHATE 10 MG/ML IJ SOLN
INTRAMUSCULAR | Status: DC | PRN
  Administered 2018-07-01: 10 mg via INTRAVENOUS

## 2018-07-01 MED ORDER — ONDANSETRON HCL 4 MG/2ML IJ SOLN
INTRAMUSCULAR | Status: AC
  Filled 2018-07-01: qty 2

## 2018-07-01 MED ORDER — FENTANYL CITRATE (PF) 100 MCG/2ML IJ SOLN
INTRAMUSCULAR | Status: AC
  Filled 2018-07-01: qty 2

## 2018-07-01 MED ORDER — DEXTROSE-NACL 5-0.45 % IV SOLN
INTRAVENOUS | Status: DC
  Administered 2018-07-01: 14:00:00 via INTRAVENOUS

## 2018-07-01 MED ORDER — ZOLPIDEM TARTRATE 5 MG PO TABS: 5.0000 mg | ORAL_TABLET | Freq: Every evening | ORAL | Status: DC | PRN

## 2018-07-01 MED ORDER — SUCCINYLCHOLINE CHLORIDE 20 MG/ML IJ SOLN
INTRAMUSCULAR | Status: AC
  Filled 2018-07-01: qty 1

## 2018-07-01 MED ORDER — ACETAMINOPHEN 160 MG/5ML PO SOLN
650.0000 mg | ORAL | Status: DC | PRN
  Filled 2018-07-01: qty 20.3

## 2018-07-01 MED ORDER — BACITRACIN ZINC 500 UNIT/GM EX OINT
TOPICAL_OINTMENT | CUTANEOUS | Status: AC
  Filled 2018-07-01: qty 28.35

## 2018-07-01 MED ORDER — MORPHINE SULFATE (PF) 4 MG/ML IV SOLN: 2.0000 mg | INTRAVENOUS | Status: DC | PRN

## 2018-07-01 MED ORDER — ONDANSETRON HCL 4 MG/2ML IJ SOLN
INTRAMUSCULAR | Status: DC | PRN
  Administered 2018-07-01: 4 mg via INTRAVENOUS

## 2018-07-01 MED ORDER — PROPOFOL 10 MG/ML IV BOLUS
INTRAVENOUS | Status: DC | PRN
  Administered 2018-07-01: 150 mg via INTRAVENOUS
  Administered 2018-07-01: 50 mg via INTRAVENOUS

## 2018-07-01 MED ORDER — LIDOCAINE HCL (CARDIAC) PF 100 MG/5ML IV SOSY
PREFILLED_SYRINGE | INTRAVENOUS | Status: DC | PRN
  Administered 2018-07-01: 60 mg via INTRAVENOUS

## 2018-07-01 MED ORDER — MIDAZOLAM HCL 2 MG/2ML IJ SOLN
INTRAMUSCULAR | Status: DC | PRN
  Administered 2018-07-01: 2 mg via INTRAVENOUS

## 2018-07-01 MED ORDER — ACETAMINOPHEN 650 MG RE SUPP: 650.0000 mg | RECTAL | Status: DC | PRN

## 2018-07-01 MED ORDER — OXYCODONE HCL 5 MG/5ML PO SOLN: 5.0000 mg | Freq: Once | ORAL | Status: DC | PRN

## 2018-07-01 MED ORDER — PROMETHAZINE HCL 25 MG/ML IJ SOLN: 12.5000 mg | Freq: Once | INTRAMUSCULAR | Status: DC | PRN

## 2018-07-01 MED ORDER — OXYCODONE HCL 5 MG PO TABS: 5.0000 mg | ORAL_TABLET | Freq: Once | ORAL | Status: DC | PRN

## 2018-07-01 MED ORDER — EPHEDRINE SULFATE 50 MG/ML IJ SOLN
INTRAMUSCULAR | Status: AC
  Filled 2018-07-01: qty 1

## 2018-07-01 MED ORDER — MIDAZOLAM HCL 2 MG/2ML IJ SOLN
INTRAMUSCULAR | Status: AC
  Filled 2018-07-01: qty 2

## 2018-07-01 MED ORDER — SCOPOLAMINE 1 MG/3DAYS TD PT72
MEDICATED_PATCH | TRANSDERMAL | Status: AC
  Administered 2018-07-01: 1.5 mg via TRANSDERMAL
  Filled 2018-07-01: qty 1

## 2018-07-01 MED ORDER — SENNOSIDES-DOCUSATE SODIUM 8.6-50 MG PO TABS: 1.0000 | ORAL_TABLET | Freq: Every evening | ORAL | Status: DC | PRN

## 2018-07-01 MED ORDER — LIDOCAINE HCL (PF) 2 % IJ SOLN
INTRAMUSCULAR | Status: AC
  Filled 2018-07-01: qty 10

## 2018-07-01 MED ORDER — ONDANSETRON HCL 4 MG PO TABS: 4.0000 mg | ORAL_TABLET | ORAL | Status: DC | PRN

## 2018-07-01 MED ORDER — BACITRACIN ZINC 500 UNIT/GM EX OINT
1.0000 "application " | TOPICAL_OINTMENT | Freq: Three times a day (TID) | CUTANEOUS | Status: DC
  Administered 2018-07-01 – 2018-07-02 (×3): 1 via TOPICAL
  Filled 2018-07-01: qty 0.9

## 2018-07-01 MED ORDER — OXYCODONE HCL 5 MG/5ML PO SOLN: 5.0000 mg | ORAL | Status: DC | PRN

## 2018-07-01 MED ORDER — DEXMEDETOMIDINE HCL 200 MCG/2ML IV SOLN
INTRAVENOUS | Status: DC | PRN
  Administered 2018-07-01 (×4): 20 ug via INTRAVENOUS

## 2018-07-01 MED ORDER — SUCCINYLCHOLINE CHLORIDE 20 MG/ML IJ SOLN
INTRAMUSCULAR | Status: DC | PRN
  Administered 2018-07-01: 100 mg via INTRAVENOUS

## 2018-07-01 MED ORDER — PHENYLEPHRINE HCL (PRESSORS) 10 MG/ML IV SOLN
INTRAVENOUS | Status: AC
  Filled 2018-07-01: qty 1

## 2018-07-01 MED ORDER — ONDANSETRON HCL 4 MG/2ML IJ SOLN
4.0000 mg | INTRAMUSCULAR | Status: DC | PRN
  Administered 2018-07-01: 4 mg via INTRAVENOUS
  Filled 2018-07-01: qty 2

## 2018-07-01 MED ORDER — FENTANYL CITRATE (PF) 100 MCG/2ML IJ SOLN
INTRAMUSCULAR | Status: DC | PRN
  Administered 2018-07-01: 50 ug via INTRAVENOUS
  Administered 2018-07-01: 100 ug via INTRAVENOUS
  Administered 2018-07-01: 50 ug via INTRAVENOUS

## 2018-07-01 MED ORDER — LACTATED RINGERS IV SOLN
INTRAVENOUS | Status: DC
  Administered 2018-07-01: 07:00:00 via INTRAVENOUS

## 2018-07-01 MED ORDER — LIDOCAINE-EPINEPHRINE 1 %-1:100000 IJ SOLN
INTRAMUSCULAR | Status: AC
  Filled 2018-07-01: qty 1

## 2018-07-01 MED ORDER — FENTANYL CITRATE (PF) 100 MCG/2ML IJ SOLN: 25.0000 ug | INTRAMUSCULAR | Status: DC | PRN

## 2018-07-01 MED ORDER — FAMOTIDINE 20 MG PO TABS
20.0000 mg | ORAL_TABLET | Freq: Once | ORAL | Status: AC
  Administered 2018-07-01: 20 mg via ORAL

## 2018-07-01 MED ORDER — SCOPOLAMINE 1 MG/3DAYS TD PT72
1.0000 | MEDICATED_PATCH | TRANSDERMAL | Status: DC
  Administered 2018-07-01: 1.5 mg via TRANSDERMAL

## 2018-07-01 MED ORDER — PROPOFOL 10 MG/ML IV BOLUS
INTRAVENOUS | Status: AC
  Filled 2018-07-01: qty 20

## 2018-07-01 MED ORDER — DEXMEDETOMIDINE HCL IN NACL 200 MCG/50ML IV SOLN
INTRAVENOUS | Status: AC
  Filled 2018-07-01: qty 50

## 2018-07-01 MED ORDER — PRAVASTATIN SODIUM 20 MG PO TABS
20.0000 mg | ORAL_TABLET | Freq: Every day | ORAL | Status: DC
  Administered 2018-07-01: 20 mg via ORAL
  Filled 2018-07-01: qty 1

## 2018-07-01 MED ORDER — EPHEDRINE SULFATE 50 MG/ML IJ SOLN
INTRAMUSCULAR | Status: DC | PRN
  Administered 2018-07-01: 15 mg via INTRAVENOUS

## 2018-07-01 MED ORDER — FAMOTIDINE 20 MG PO TABS
ORAL_TABLET | ORAL | Status: AC
  Administered 2018-07-01: 20 mg via ORAL
  Filled 2018-07-01: qty 1

## 2018-07-01 MED ORDER — LIDOCAINE-EPINEPHRINE 1 %-1:100000 IJ SOLN
INTRAMUSCULAR | Status: DC | PRN
  Administered 2018-07-01: 8 mL

## 2018-07-01 SURGICAL SUPPLY — 42 items
ADHESIVE MASTISOL STRL (MISCELLANEOUS) ×3 IMPLANT
BLADE SURG 15 STRL LF DISP TIS (BLADE) ×1 IMPLANT
BLADE SURG 15 STRL SS (BLADE) ×2
CANISTER SUCT 1200ML W/VALVE (MISCELLANEOUS) ×3 IMPLANT
CLOSURE WOUND 1/4X4 (GAUZE/BANDAGES/DRESSINGS) ×1
CORD BIP STRL DISP 12FT (MISCELLANEOUS) ×3 IMPLANT
COTTON BALL STRL MEDIUM (GAUZE/BANDAGES/DRESSINGS) ×3 IMPLANT
COVER WAND RF STERILE (DRAPES) ×3 IMPLANT
DERMABOND ADVANCED (GAUZE/BANDAGES/DRESSINGS) ×2
DERMABOND ADVANCED .7 DNX12 (GAUZE/BANDAGES/DRESSINGS) ×1 IMPLANT
DRAIN TLS ROUND 10FR (DRAIN) IMPLANT
DRAPE MAG INST 16X20 L/F (DRAPES) ×3 IMPLANT
DRAPE SURG 17X11 SM STRL (DRAPES) ×3 IMPLANT
DRSG TEGADERM 2-3/8X2-3/4 SM (GAUZE/BANDAGES/DRESSINGS) ×9 IMPLANT
ELECT NEEDLE 20X.3 GREEN (MISCELLANEOUS)
ELECT REM PT RETURN 9FT ADLT (ELECTROSURGICAL) ×3
ELECTRODE NEEDLE 20X.3 GREEN (MISCELLANEOUS) IMPLANT
ELECTRODE REM PT RTRN 9FT ADLT (ELECTROSURGICAL) ×1 IMPLANT
FORCEPS JEWEL BIP 4-3/4 STR (INSTRUMENTS) ×3 IMPLANT
GAUZE 4X4 16PLY RFD (DISPOSABLE) ×6 IMPLANT
GLOVE BIO SURGEON STRL SZ7.5 (GLOVE) ×6 IMPLANT
GOWN STRL REUS W/ TWL LRG LVL3 (GOWN DISPOSABLE) ×3 IMPLANT
GOWN STRL REUS W/TWL LRG LVL3 (GOWN DISPOSABLE) ×6
HEMOSTAT SURGICEL 2X3 (HEMOSTASIS) ×3 IMPLANT
HOOK STAY BLUNT/RETRACTOR 5M (MISCELLANEOUS) ×3 IMPLANT
JACKSON PRATT 7MM (INSTRUMENTS) ×3 IMPLANT
KIT TURNOVER KIT A (KITS) ×3 IMPLANT
LABEL OR SOLS (LABEL) IMPLANT
MARKER SKIN DUAL TIP RULER LAB (MISCELLANEOUS) ×3 IMPLANT
NS IRRIG 500ML POUR BTL (IV SOLUTION) ×3 IMPLANT
PACK HEAD/NECK (MISCELLANEOUS) ×3 IMPLANT
PROBE MONO 100X0.75 ELECT 1.9M (MISCELLANEOUS) ×3 IMPLANT
SHEARS HARMONIC 9CM CVD (BLADE) ×3 IMPLANT
SPONGE KITTNER 5P (MISCELLANEOUS) ×12 IMPLANT
STRIP CLOSURE SKIN 1/4X4 (GAUZE/BANDAGES/DRESSINGS) ×2 IMPLANT
SUT PROLENE 5 0 PS 3 (SUTURE) ×3 IMPLANT
SUT SILK 0 (SUTURE) ×2
SUT SILK 0 30XBRD TIE 6 (SUTURE) ×1 IMPLANT
SUT SILK 2 0 (SUTURE) ×2
SUT SILK 2-0 30XBRD TIE 12 (SUTURE) ×1 IMPLANT
SUT VIC AB 4-0 RB1 18 (SUTURE) ×6 IMPLANT
SYSTEM CHEST DRAIN TLS 7FR (DRAIN) ×3 IMPLANT

## 2018-07-01 NOTE — Anesthesia Post-op Follow-up Note (Signed)
Anesthesia QCDR form completed.        

## 2018-07-01 NOTE — H&P (Signed)
..  History and Physical paper copy reviewed and updated date of procedure and will be scanned into system.  Patient seen and examined.  

## 2018-07-01 NOTE — Op Note (Signed)
..07/01/2018  9:59 AM    Kaitlyn Ponce  [id number]   Pre-Op Dx: Neoplasm uncertain behavior, Parotid Gland  Post-op Dx: same  Proc: Right Superficial Parotidectomy with Facial Nerve Monitoring  Surg: Jeannie Fend Shavonte Zhao  Asst:  McQueen  Anes:  GOT  EBL: 10  Comp:  none  Findings:  Facial nerve identified and traced with all branches found and all branches stimulated at completion of case.  Large superficial parotid mass removed in its entirety  Procedure:  After the patient was identified in hold and the history and physical and consent was reviewed and updated. The patient was marked in the normal fashion in an existing skin crease of her right preauricular crease extending down to the lobe and a gentle curve inferiorly to a neck crease. The patient was next taken to the operating room and placed in a supine position. General endotracheal anesthesia was induced in the normal fashion. The patient's right neck and face was neck injected with 8cc's of 1% lidocaine with 1:200,000 Epinephrine.  Facial nerve electrodes using NIMS nerve monitor were placed in the oribicularis oculi and orbiularis oris.  These were covered with a 1000 drape and ensured stimulation of the monitor. The patient was next prepped and draped in a sterile normal fashion.  At this time, a 15 blade scalpel was used to make a skin incision along the previously marked facial and neck crease in the right neck after the proper time out was performed. Dissection was carefully performed through the subcutaneous tissues with combination of Bovie electrocautery and blunt dissection. The platysma muscle was divided inferiorly with harmonic scalpel and the parotid capsule was identified.  Using Mets scissors a plane in just superior to the SMAS was developed and dissected until anterior to the right parotid mass.  The connections of the parotid to the [facility] were divided inferiorly and the greater auricular nerve divided as  well.  The connections of the parotid to the external cartilagenous auditory canal were divided with Bovie cautery.  Once the boney canal was encountered this was dissected bluntly and divided with harmonic scalpel..  A nerve stimulator was used to ensure no injury to the facial nerve trunk.  The tympanomastoid suture and the styloid process were identified.  The nerve was found inferior to the suture line just adjacent to the styloid process.  This stimulated with robust stimulation of all facial nerve branches.  This nerve was dissected and followed carefully until the superior and inferior branches were traced.  The superior branch was traced for its two superior branches.  The lateral portion of the parotid overlying these nerve branches were divided with Harmonic scalpel.  Care was taken to avoid nerve injury.  Stimulator was employed to ensure all branches continued to be intact.  The branches of the nerve were dissected away from the superior aspect of the parotid mass.  Care was taken to ensure removal of capsule of normal gland around mass.  Once the superior branches were fully dissected and away from the mass, attention was directed to the inferior aspect.  The inferior branches were dissected away from the mass inferiorly with the buccal, marginal mandibular, and cervical branches identified.  Once the facial nerve had been dissected away from the mass, the remaining attachments were divided.  The mass was sent for permanent pathological evaluation.  The wound was copiously irrigated.  Meticulous hemostasis was achieved.  The nerve robustly stimulated.  Surgicel was placed within the wound bed and a 7.0 drain  was placed.  The SMAS was dissected and closed with the posteriorly aspect and superiorly and the fascial overlying the SCM inferiorly.  Meticulous hemostasis was continued.  The subdermal tissues were closed with intermittent vicryl.  Care was taken to ensure the ear lobe and curvature in the neck  were closed appropriately.  The skin was closed with running prolene suture and topped with Bacitracin.  At this time the patient was extubated and taken to PACU in good condition.    Dispo: PACU in good condition.  Plan:  Ice, elevation, analgesia.  [name] 07/01/2018 9:59 AM

## 2018-07-01 NOTE — Care Management Obs Status (Signed)
[place] NOTIFICATION   Patient Details  Name: Kaitlyn Ponce MRN: [id number] Date of Birth: 1950/03/28   Medicare Observation Status Notification Given:  Yes    Tommy Medal 07/01/2018, 4:34 PM

## 2018-07-01 NOTE — Anesthesia Preprocedure Evaluation (Signed)
Anesthesia Evaluation  Patient identified by MRN, date of birth, ID band Patient awake    Reviewed: Allergy & Precautions, H&P , NPO status , Patient's Chart, lab work & pertinent test results  History of Anesthesia Complications (+) PONV and history of anesthetic complications  Airway Mallampati: III  TM Distance: <3 FB Neck ROM: limited    Dental  (+) Chipped, Caps   Pulmonary neg pulmonary ROS, neg shortness of breath,           Cardiovascular Exercise Tolerance: Good (-) angina(-) Past MI and (-) DOE negative cardio ROS       Neuro/Psych negative neurological ROS  negative psych ROS   GI/Hepatic Neg liver ROS, GERD  Medicated and Controlled,  Endo/Other  negative endocrine ROS  Renal/GU      Musculoskeletal   Abdominal   Peds  Hematology negative hematology ROS (+)   Anesthesia Other Findings Past Medical History: No date: Allergy     Comment:  cat, dust No date: GERD (gastroesophageal reflux disease) No date: High cholesterol     Comment:  taking medication, declined nutritional referral at this              time 10/11/17 No date: History of tonsillectomy No date: PONV (postoperative nausea and vomiting)  Past Surgical History: 2010: BREAST BIOPSY; Left     Comment:  [place] core No date: TONSILLECTOMY 1987: TUBAL LIGATION 2002: VAGINAL HYSTERECTOMY  BMI    Body Mass Index:  44.81 kg/m      Reproductive/Obstetrics negative OB ROS                             Anesthesia Physical Anesthesia Plan  ASA: III  Anesthesia Plan: General ETT   Post-op Pain Management:    Induction: Intravenous  PONV Risk Score and Plan: Ondansetron, Dexamethasone, Midazolam, Treatment may vary due to age or medical condition and Scopolamine patch - Pre-op  Airway Management Planned: Oral ETT and Video Laryngoscope Planned  Additional Equipment:   Intra-op Plan:   Post-operative Plan:  Extubation in OR  Informed Consent: I have reviewed the patients History and Physical, chart, labs and discussed the procedure including the risks, benefits and alternatives for the proposed anesthesia with the patient or authorized representative who has indicated his/her understanding and acceptance.     Dental Advisory Given  Plan Discussed with: Anesthesiologist, CRNA and Surgeon  Anesthesia Plan Comments: (Patient consented for risks of anesthesia including but not limited to:  - adverse reactions to medications - damage to teeth, lips or other oral mucosa - sore throat or hoarseness - Damage to heart, brain, lungs or loss of life  Patient voiced understanding.)        Anesthesia Quick Evaluation

## 2018-07-01 NOTE — Progress Notes (Signed)
Patient called me in to assess her IV. It was half way out and swollen. Patient said she thought she got it caught on something. I called Dr Pryor Ochoa and he said OK to leave IV out. Patient said she was not having any pain and/or nausea. She is taking in plenty of liquids and urinating about every 2-3 hours.

## 2018-07-01 NOTE — Progress Notes (Signed)
..   07/01/2018 6:07 PM  Kaitlyn Ponce [id number]  Post-Op Day 0    Temp:  [96.5 F (35.8 C)-98.2 F (36.8 C)] 98.1 F (36.7 C) (06/09 1304) Pulse Rate:  [71-77] 72 (06/09 1304) Resp:  [12-18] 12 (06/09 1304) BP: (105-141)/(57-98) 123/63 (06/09 1304) SpO2:  [95 %-99 %] 95 % (06/09 1304),     Intake/Output Summary (Last 24 hours) at 07/01/2018 1807 Last data filed at 07/01/2018 1659 Gross per 24 hour  Intake 720 ml  Output 305 ml  Net 415 ml    No results found for this or any previous visit (from the past 24 hour(s)).  SUBJECTIVE:  No acute events.  Tolerating pain.  Nausea and vomitting x 1.  Voiding well.  OBJECTIVE:   GEN-  NAD NEURO-  Slight asymmtetry with movement and rest on right side, good mobility throughout all branches NECK-  Incision c/d/i with bacitracin ointment in place, drain in place.  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  Anticipate resolution of nerve weakness given good mobility.  Ice pack as needed.  Most likely discharge home tomorrow after pulling drain.  [name] 07/01/2018, 6:07 PM

## 2018-07-01 NOTE — Anesthesia Procedure Notes (Signed)
Procedure Name: Intubation Date/Time: 07/01/2018 7:22 AM Performed by: Jonna Clark, CRNA Pre-anesthesia Checklist: Patient identified, Patient being monitored, Timeout performed, Emergency Drugs available and Suction available Patient Re-evaluated:Patient Re-evaluated prior to induction Oxygen Delivery Method: Circle system utilized Preoxygenation: Pre-oxygenation with 100% oxygen Induction Type: IV induction Ventilation: Mask ventilation without difficulty Laryngoscope Size: 3 and McGraph Grade View: Grade I Tube type: Oral Tube size: 7.0 mm Number of attempts: 1 Airway Equipment and Method: Stylet Placement Confirmation: ETT inserted through vocal cords under direct vision,  positive ETCO2 and breath sounds checked- equal and bilateral Secured at: 21 cm Tube secured with: Tape Dental Injury: Teeth and Oropharynx as per pre-operative assessment

## 2018-07-01 NOTE — Anesthesia Postprocedure Evaluation (Signed)
Anesthesia Post Note  Patient: Kaitlyn Ponce  Procedure(s) Performed: PAROTIDECTOMY (N/A Neck)  Patient location during evaluation: PACU Anesthesia Type: General Level of consciousness: awake and alert Pain management: pain level controlled Vital Signs Assessment: post-procedure vital signs reviewed and stable Respiratory status: spontaneous breathing, nonlabored ventilation, respiratory function stable and patient connected to nasal cannula oxygen Cardiovascular status: blood pressure returned to baseline and stable Postop Assessment: no apparent nausea or vomiting Anesthetic complications: no     Last Vitals:  Vitals:   07/01/18 1038 07/01/18 1053  BP: 130/69 133/71  Pulse: 75 76  Resp: 12 13  Temp:  (!) 36.2 C  SpO2: 95% 95%    Last Pain:  Vitals:   07/01/18 1053  TempSrc:   PainSc: 0-No pain                 Precious Haws Welborn Keena

## 2018-07-01 NOTE — Transfer of Care (Signed)
Immediate Anesthesia Transfer of Care Note  Patient: Kaitlyn Ponce  Procedure(s) Performed: PAROTIDECTOMY (N/A Neck)  Patient Location: PACU  Anesthesia Type:General  Level of Consciousness: drowsy and patient cooperative  Airway & Oxygen Therapy: Patient Spontanous Breathing and Patient connected to face mask oxygen  Post-op Assessment: Report given to RN and Post -op Vital signs reviewed and stable  Post vital signs: Reviewed and stable  Last Vitals:  Vitals Value Taken Time  BP 105/57 07/01/2018 10:08 AM  Temp 35.8 C 07/01/2018 10:08 AM  Pulse 77 07/01/2018 10:10 AM  Resp 16 07/01/2018 10:08 AM  SpO2 98 % 07/01/2018 10:10 AM  Vitals shown include unvalidated device data.  Last Pain:  Vitals:   07/01/18 1008  TempSrc:   PainSc: Asleep         Complications: No apparent anesthesia complications

## 2018-07-02 ENCOUNTER — Encounter: Payer: Self-pay | Admitting: Otolaryngology

## 2018-07-02 DIAGNOSIS — D110 Benign neoplasm of parotid gland: Secondary | ICD-10-CM | POA: Diagnosis not present

## 2018-07-02 MED ORDER — BACITRACIN ZINC 500 UNIT/GM EX OINT: 1.0000 "application " | TOPICAL_OINTMENT | Freq: Three times a day (TID) | CUTANEOUS | 0 refills | Status: DC

## 2018-07-02 MED ORDER — ONDANSETRON HCL 4 MG PO TABS: 4.0000 mg | ORAL_TABLET | ORAL | 0 refills | Status: DC | PRN

## 2018-07-02 NOTE — Final Progress Note (Signed)
..   07/02/2018 8:24 AM  Kaitlyn Ponce [id number]  Post-Op Day 1    Temp:  [96.5 F (35.8 C)-98.1 F (36.7 C)] 97.8 F (36.6 C) (06/10 0529) Pulse Rate:  [59-77] 66 (06/10 0529) Resp:  [12-20] 20 (06/10 0529) BP: (105-153)/(57-73) 128/64 (06/10 0529) SpO2:  [95 %-100 %] 95 % (06/10 0529),     Intake/Output Summary (Last 24 hours) at 07/02/2018 0824 Last data filed at 07/02/2018 0400 Gross per 24 hour  Intake 1627.48 ml  Output 1805 ml  Net -177.52 ml    No results found for this or any previous visit (from the past 24 hour(s)).  SUBJECTIVE:  No acute events.  Tolerating pain with no medication.  Nausea improved.  Tolerating PO.  OBJECTIVE:  GEN-  NAD, resting in bed NECK-  Mild dependant bruising at inferior flap, icision c/d/i and soft with mild edema.  Drain with serosanguinous fluid and drain removed. NEURO-  Continues to have slight weakness with movement of all branches of facial nerve but good mobility with complete eye closure  IMPRESSION:  S/p Right superficial parotidectomy with facial nerve monitoring  PLAN:  Discharge home.  Discussed facial nerve exercises given continued mild weakness but ensured patient I anticipate complete recovery.  Follow up Next Tuesday for suture removal.  Blair Lundeen 07/02/2018, 8:24 AM

## 2018-07-02 NOTE — Progress Notes (Signed)
Discharge instructions reviewed with patient including followup visits and new medications.  Understanding was verbalized and all questions were answered.  Patient discharged home via wheelchair in stable condition escorted by nursing staff.

## 2018-07-03 LAB — SURGICAL PATHOLOGY

## 2018-08-19 ENCOUNTER — Ambulatory Visit: Payer: Self-pay | Admitting: Orthopedic Surgery

## 2018-08-29 ENCOUNTER — Encounter
Admission: RE | Admit: 2018-08-29 | Discharge: 2018-08-29 | Disposition: A | Discharge status: Home / Self Care | Payer: Medicare Other | Source: Ambulatory Visit | Attending: Orthopedic Surgery | Admitting: Orthopedic Surgery

## 2018-08-29 ENCOUNTER — Other Ambulatory Visit: Payer: Self-pay

## 2018-08-29 DIAGNOSIS — Z01812 Encounter for preprocedural laboratory examination: Secondary | ICD-10-CM | POA: Diagnosis not present

## 2018-08-29 HISTORY — DX: Unspecified osteoarthritis, unspecified site: M19.90

## 2018-08-29 HISTORY — DX: Dyspnea, unspecified: R06.00

## 2018-08-29 LAB — CBC
HCT: 45.7 % (ref 36.0–46.0)
Hemoglobin: 14.9 g/dL (ref 12.0–15.0)
MCH: 29.4 pg (ref 26.0–34.0)
MCHC: 32.6 g/dL (ref 30.0–36.0)
MCV: 90.3 fL (ref 80.0–100.0)
Platelets: 195 10*3/uL (ref 150–400)
RBC: 5.06 MIL/uL (ref 3.87–5.11)
RDW: 13.1 % (ref 11.5–15.5)
WBC: 7.5 10*3/uL (ref 4.0–10.5)
nRBC: 0 % (ref 0.0–0.2)

## 2018-08-29 LAB — TYPE AND SCREEN
ABO/RH(D): O POS
Antibody Screen: NEGATIVE

## 2018-08-29 LAB — URINALYSIS, ROUTINE W REFLEX MICROSCOPIC
Bilirubin Urine: NEGATIVE
Glucose, UA: NEGATIVE mg/dL
Hgb urine dipstick: NEGATIVE
Ketones, ur: NEGATIVE mg/dL
Leukocytes,Ua: NEGATIVE
Nitrite: NEGATIVE
Protein, ur: NEGATIVE mg/dL
Specific Gravity, Urine: 1.014 (ref 1.005–1.030)
pH: 5 (ref 5.0–8.0)

## 2018-08-29 LAB — BASIC METABOLIC PANEL
Anion gap: 10 (ref 5–15)
BUN: 16 mg/dL (ref 8–23)
CO2: 26 mmol/L (ref 22–32)
Calcium: 9.2 mg/dL (ref 8.9–10.3)
Chloride: 104 mmol/L (ref 98–111)
Creatinine, Ser: 1.07 mg/dL — ABNORMAL HIGH (ref 0.44–1.00)
GFR calc Af Amer: >60 mL/min (ref >60)
GFR calc non Af Amer: 54 mL/min — ABNORMAL LOW (ref >60)
Glucose, Bld: 92 mg/dL (ref 70–99)
Potassium: 3.6 mmol/L (ref 3.5–5.1)
Sodium: 140 mmol/L (ref 135–145)

## 2018-08-29 LAB — APTT: aPTT: 30 seconds (ref 24–36)

## 2018-08-29 LAB — PROTIME-INR
INR: 1 (ref 0.8–1.2)
Prothrombin Time: 12.7 seconds (ref 11.4–15.2)

## 2018-08-29 LAB — SURGICAL PCR SCREEN
MRSA, PCR: NEGATIVE
Staphylococcus aureus: NEGATIVE

## 2018-08-29 NOTE — Pre-Procedure Instructions (Signed)
Carbohydrate drink and incentive spirometry given along with instructions.

## 2018-08-29 NOTE — Patient Instructions (Addendum)
Your procedure is scheduled on: 09/10/2018 Wed Report to Same Day Surgery 2nd floor medical mall [facility] Entrance-take elevator on left to 2nd floor.  Check in with surgery information desk.) To find out your arrival time please call [phone] between 1PM - 3PM on 09/09/2018 Tues  Remember: Instructions that are not followed completely may result in serious medical risk, up to and including death, or upon the discretion of your surgeon and anesthesiologist your surgery may need to be rescheduled.    _x___ 1. Do not eat food after midnight the night before your procedure. You may drink clear liquids up to 2 hours before you are scheduled to arrive at the hospital for your procedure.  Do not drink clear liquids within 2 hours of your scheduled arrival to the hospital.  Clear liquids include  --Water or Apple juice without pulp  --Clear carbohydrate beverage such as ClearFast or Gatorade  --Black Coffee or Clear Tea (No milk, no creamers, do not add anything to                  the coffee or Tea Type 1 and type 2 diabetics should only drink water.   ____Ensure clear carbohydrate drink on the way to the hospital for bariatric patients  _X___Ensure clear carbohydrate drink 3 hours before surgery.   No gum chewing or hard candies.     __x__ 2. No Alcohol for 24 hours before or after surgery.   __x__3. No Smoking or e-cigarettes for 24 prior to surgery.  Do not use any chewable tobacco products for at least 6 hour prior to surgery   ____  4. Bring all medications with you on the day of surgery if instructed.    __x__ 5. Notify your doctor if there is any change in your medical condition     (cold, fever, infections).    x___6. On the morning of surgery brush your teeth with toothpaste and water.  You may rinse your mouth with mouth wash if you wish.  Do not swallow any toothpaste or mouthwash.   Do not wear jewelry, make-up, hairpins, clips or nail polish.  Do not wear lotions,  powders, or perfumes. You may wear deodorant.  Do not shave 48 hours prior to surgery. Men may shave face and neck.  Do not bring valuables to the hospital.    [facility] is not responsible for any belongings or valuables.               Contacts, dentures or bridgework may not be worn into surgery.  Leave your suitcase in the car. After surgery it may be brought to your room.  For patients admitted to the hospital, discharge time is determined by your                       treatment team.  _  Patients discharged the day of surgery will not be allowed to drive home.  You will need someone to drive you home and stay with you the night of your procedure.    Please read over the following fact sheets that you were given:   [facility] Preparing for Surgery and or MRSA Information   _x___ Take anti-hypertensive listed below, cardiac, seizure, asthma,     anti-reflux and psychiatric medicines. These include:  1. None  2.  3.  4.  5.  6.  ____Fleets enema or Magnesium Citrate as directed.   _x___ Use CHG Soap or  sage wipes as directed on instruction sheet   ____ Use inhalers on the day of surgery and bring to hospital day of surgery  ____ Stop Metformin and Janumet 2 days prior to surgery.    ____ Take 1/2 of usual insulin dose the night before surgery and none on the morning     surgery.   _x___ Follow recommendations from Cardiologist, Pulmonologist or PCP regarding          stopping Aspirin, Coumadin, Plavix ,Eliquis, Effient, or Pradaxa, and Pletal.  X____Stop Anti-inflammatories such as Advil, Aleve, Ibuprofen, Motrin, Naproxen, Naprosyn, Goodies powders or aspirin products. OK to take Tylenol and                          Celebrex.   _x___ Stop supplements until after surgery.  But may continue Vitamin D, Vitamin B,       and multivitamin.   ____ Bring C-Pap to the hospital.

## 2018-09-01 NOTE — Pre-Procedure Instructions (Signed)
CLEARANCE FROM PCP ON CHART

## 2018-09-08 ENCOUNTER — Other Ambulatory Visit: Admission: RE | Admit: 2018-09-08 | Payer: Medicare Other | Source: Ambulatory Visit

## 2018-09-19 ENCOUNTER — Other Ambulatory Visit: Payer: Self-pay

## 2018-09-19 ENCOUNTER — Other Ambulatory Visit
Admission: RE | Admit: 2018-09-19 | Discharge: 2018-09-19 | Disposition: A | Discharge status: Home / Self Care | Payer: Medicare Other | Source: Ambulatory Visit | Attending: Orthopedic Surgery | Admitting: Orthopedic Surgery

## 2018-09-19 DIAGNOSIS — Z01812 Encounter for preprocedural laboratory examination: Secondary | ICD-10-CM | POA: Diagnosis present

## 2018-09-19 DIAGNOSIS — Z20828 Contact with and (suspected) exposure to other viral communicable diseases: Secondary | ICD-10-CM | POA: Diagnosis not present

## 2018-09-20 LAB — SARS CORONAVIRUS 2 (TAT 6-24 HRS): SARS Coronavirus 2: NEGATIVE

## 2018-09-23 MED ORDER — CEFAZOLIN SODIUM-DEXTROSE 2-4 GM/100ML-% IV SOLN
2.0000 g | INTRAVENOUS | Status: AC
  Administered 2018-09-24: 12:00:00 2 g via INTRAVENOUS

## 2018-09-23 MED ORDER — TRANEXAMIC ACID-NACL 1000-0.7 MG/100ML-% IV SOLN
1000.0000 mg | INTRAVENOUS | Status: AC
  Administered 2018-09-24: 1000 mg via INTRAVENOUS
  Filled 2018-09-23: qty 100

## 2018-09-24 ENCOUNTER — Ambulatory Visit: Payer: Medicare Other | Admitting: Anesthesiology

## 2018-09-24 ENCOUNTER — Ambulatory Visit
Admission: RE | Admit: 2018-09-24 | Discharge: 2018-09-25 | Disposition: A | Discharge status: Home / Self Care | Payer: Medicare Other | Attending: Orthopedic Surgery | Admitting: Orthopedic Surgery

## 2018-09-24 ENCOUNTER — Other Ambulatory Visit: Payer: Self-pay

## 2018-09-24 ENCOUNTER — Encounter
Admission: RE | Disposition: A | Discharge status: Home / Self Care | Payer: Self-pay | Source: Home / Self Care | Attending: Orthopedic Surgery

## 2018-09-24 ENCOUNTER — Ambulatory Visit: Payer: Medicare Other

## 2018-09-24 DIAGNOSIS — Z79899 Other long term (current) drug therapy: Secondary | ICD-10-CM | POA: Diagnosis not present

## 2018-09-24 DIAGNOSIS — Z96651 Presence of right artificial knee joint: Secondary | ICD-10-CM

## 2018-09-24 DIAGNOSIS — E7800 Pure hypercholesterolemia, unspecified: Secondary | ICD-10-CM | POA: Diagnosis not present

## 2018-09-24 DIAGNOSIS — Z7982 Long term (current) use of aspirin: Secondary | ICD-10-CM | POA: Diagnosis not present

## 2018-09-24 DIAGNOSIS — Z09 Encounter for follow-up examination after completed treatment for conditions other than malignant neoplasm: Secondary | ICD-10-CM

## 2018-09-24 DIAGNOSIS — K219 Gastro-esophageal reflux disease without esophagitis: Secondary | ICD-10-CM | POA: Diagnosis not present

## 2018-09-24 DIAGNOSIS — M1711 Unilateral primary osteoarthritis, right knee: Secondary | ICD-10-CM | POA: Insufficient documentation

## 2018-09-24 DIAGNOSIS — M6281 Muscle weakness (generalized): Secondary | ICD-10-CM | POA: Insufficient documentation

## 2018-09-24 HISTORY — PX: TOTAL KNEE ARTHROPLASTY: SHX125

## 2018-09-24 LAB — TYPE AND SCREEN
ABO/RH(D): O POS
Antibody Screen: NEGATIVE

## 2018-09-24 SURGERY — ARTHROPLASTY, KNEE, TOTAL
Anesthesia: Spinal | Laterality: Right

## 2018-09-24 MED ORDER — EPHEDRINE SULFATE 50 MG/ML IJ SOLN
INTRAMUSCULAR | Status: DC | PRN
  Administered 2018-09-24 (×2): 10 mg via INTRAVENOUS

## 2018-09-24 MED ORDER — DIPHENHYDRAMINE HCL 12.5 MG/5ML PO ELIX: 12.5000 mg | ORAL_SOLUTION | ORAL | Status: DC | PRN

## 2018-09-24 MED ORDER — PHENOL 1.4 % MT LIQD
1.0000 | OROMUCOSAL | Status: DC | PRN
  Filled 2018-09-24: qty 177

## 2018-09-24 MED ORDER — DEXAMETHASONE SODIUM PHOSPHATE 10 MG/ML IJ SOLN
INTRAMUSCULAR | Status: DC | PRN
  Administered 2018-09-24: 10 mg via INTRAVENOUS

## 2018-09-24 MED ORDER — SODIUM CHLORIDE (PF) 0.9 % IJ SOLN
INTRAMUSCULAR | Status: DC | PRN
  Administered 2018-09-24: 20 mL

## 2018-09-24 MED ORDER — MIDAZOLAM HCL 2 MG/2ML IJ SOLN
INTRAMUSCULAR | Status: AC
  Filled 2018-09-24: qty 2

## 2018-09-24 MED ORDER — FENTANYL CITRATE (PF) 100 MCG/2ML IJ SOLN: 25.0000 ug | INTRAMUSCULAR | Status: DC | PRN

## 2018-09-24 MED ORDER — METHOCARBAMOL 1000 MG/10ML IJ SOLN
500.0000 mg | Freq: Four times a day (QID) | INTRAVENOUS | Status: DC | PRN
  Filled 2018-09-24: qty 5

## 2018-09-24 MED ORDER — BISACODYL 10 MG RE SUPP: 10.0000 mg | Freq: Every day | RECTAL | Status: DC | PRN

## 2018-09-24 MED ORDER — FENTANYL CITRATE (PF) 100 MCG/2ML IJ SOLN
INTRAMUSCULAR | Status: AC
  Filled 2018-09-24: qty 2

## 2018-09-24 MED ORDER — MAGNESIUM CITRATE PO SOLN
1.0000 | Freq: Once | ORAL | Status: DC | PRN
  Filled 2018-09-24: qty 296

## 2018-09-24 MED ORDER — LACTATED RINGERS IV SOLN
INTRAVENOUS | Status: DC
  Administered 2018-09-24 (×2): via INTRAVENOUS

## 2018-09-24 MED ORDER — PRAVASTATIN SODIUM 20 MG PO TABS
20.0000 mg | ORAL_TABLET | Freq: Every day | ORAL | Status: DC
  Administered 2018-09-24: 20 mg via ORAL
  Filled 2018-09-24: qty 1

## 2018-09-24 MED ORDER — KETOROLAC TROMETHAMINE 15 MG/ML IJ SOLN
15.0000 mg | Freq: Four times a day (QID) | INTRAMUSCULAR | Status: AC
  Administered 2018-09-24 – 2018-09-25 (×4): 15 mg via INTRAVENOUS
  Filled 2018-09-24 (×4): qty 1

## 2018-09-24 MED ORDER — LACTATED RINGERS IV SOLN: INTRAVENOUS | Status: DC

## 2018-09-24 MED ORDER — BUPIVACAINE-EPINEPHRINE (PF) 0.5% -1:200000 IJ SOLN
INTRAMUSCULAR | Status: DC | PRN
  Administered 2018-09-24: 30 mL

## 2018-09-24 MED ORDER — CEFAZOLIN SODIUM-DEXTROSE 2-4 GM/100ML-% IV SOLN
INTRAVENOUS | Status: AC
  Filled 2018-09-24: qty 100

## 2018-09-24 MED ORDER — MIDAZOLAM HCL 5 MG/5ML IJ SOLN
INTRAMUSCULAR | Status: DC | PRN
  Administered 2018-09-24: 2 mg via INTRAVENOUS

## 2018-09-24 MED ORDER — ESMOLOL HCL 100 MG/10ML IV SOLN
INTRAVENOUS | Status: AC
  Filled 2018-09-24: qty 10

## 2018-09-24 MED ORDER — MORPHINE SULFATE (PF) 2 MG/ML IV SOLN: 0.5000 mg | INTRAVENOUS | Status: DC | PRN

## 2018-09-24 MED ORDER — SODIUM CHLORIDE 0.9 % IV SOLN
INTRAVENOUS | Status: DC | PRN
  Administered 2018-09-24: 60 mL

## 2018-09-24 MED ORDER — CHLORHEXIDINE GLUCONATE 4 % EX LIQD
60.0000 mL | Freq: Once | CUTANEOUS | Status: AC
  Administered 2018-09-24: 4 via TOPICAL

## 2018-09-24 MED ORDER — PROPOFOL 500 MG/50ML IV EMUL
INTRAVENOUS | Status: DC | PRN
  Administered 2018-09-24: 15 ug/kg/min via INTRAVENOUS

## 2018-09-24 MED ORDER — BUPIVACAINE-EPINEPHRINE (PF) 0.5% -1:200000 IJ SOLN
INTRAMUSCULAR | Status: AC
  Filled 2018-09-24: qty 30

## 2018-09-24 MED ORDER — METOCLOPRAMIDE HCL 5 MG/ML IJ SOLN: 5.0000 mg | Freq: Three times a day (TID) | INTRAMUSCULAR | Status: DC | PRN

## 2018-09-24 MED ORDER — ACETAMINOPHEN 500 MG PO TABS
1000.0000 mg | ORAL_TABLET | Freq: Once | ORAL | Status: AC
  Administered 2018-09-24: 10:00:00 1000 mg via ORAL

## 2018-09-24 MED ORDER — HYDROCODONE-ACETAMINOPHEN 5-325 MG PO TABS
1.0000 | ORAL_TABLET | ORAL | Status: DC | PRN
  Administered 2018-09-24 – 2018-09-25 (×2): 1 via ORAL
  Filled 2018-09-24 (×2): qty 1

## 2018-09-24 MED ORDER — TRAMADOL HCL 50 MG PO TABS
50.0000 mg | ORAL_TABLET | Freq: Four times a day (QID) | ORAL | Status: DC
  Administered 2018-09-24 – 2018-09-25 (×4): 50 mg via ORAL
  Filled 2018-09-24 (×4): qty 1

## 2018-09-24 MED ORDER — ONDANSETRON HCL 4 MG/2ML IJ SOLN
4.0000 mg | Freq: Four times a day (QID) | INTRAMUSCULAR | Status: DC | PRN
  Administered 2018-09-24: 17:00:00 4 mg via INTRAVENOUS
  Filled 2018-09-24: qty 2

## 2018-09-24 MED ORDER — ONDANSETRON HCL 4 MG/2ML IJ SOLN
INTRAMUSCULAR | Status: DC | PRN
  Administered 2018-09-24 (×2): 4 mg via INTRAVENOUS

## 2018-09-24 MED ORDER — ONDANSETRON HCL 4 MG/2ML IJ SOLN
INTRAMUSCULAR | Status: AC
  Filled 2018-09-24: qty 2

## 2018-09-24 MED ORDER — ONDANSETRON HCL 4 MG/2ML IJ SOLN: 4.0000 mg | Freq: Once | INTRAMUSCULAR | Status: DC | PRN

## 2018-09-24 MED ORDER — FAMOTIDINE 20 MG PO TABS
ORAL_TABLET | ORAL | Status: AC
  Administered 2018-09-24: 10:00:00 20 mg via ORAL
  Filled 2018-09-24: qty 1

## 2018-09-24 MED ORDER — METHOCARBAMOL 500 MG PO TABS
500.0000 mg | ORAL_TABLET | Freq: Four times a day (QID) | ORAL | Status: DC | PRN
  Administered 2018-09-24: 500 mg via ORAL
  Filled 2018-09-24: qty 1

## 2018-09-24 MED ORDER — LACTATED RINGERS IV SOLN
INTRAVENOUS | Status: DC
  Administered 2018-09-24: 20:00:00 via INTRAVENOUS

## 2018-09-24 MED ORDER — BUPIVACAINE LIPOSOME 1.3 % IJ SUSP
INTRAMUSCULAR | Status: AC
  Filled 2018-09-24: qty 20

## 2018-09-24 MED ORDER — METOCLOPRAMIDE HCL 10 MG PO TABS: 5.0000 mg | ORAL_TABLET | Freq: Three times a day (TID) | ORAL | Status: DC | PRN

## 2018-09-24 MED ORDER — DOCUSATE SODIUM 100 MG PO CAPS
100.0000 mg | ORAL_CAPSULE | Freq: Two times a day (BID) | ORAL | Status: DC
  Administered 2018-09-24 – 2018-09-25 (×2): 100 mg via ORAL
  Filled 2018-09-24 (×2): qty 1

## 2018-09-24 MED ORDER — SODIUM CHLORIDE 0.9 % IV SOLN
INTRAVENOUS | Status: DC | PRN
  Administered 2018-09-24: 50 ug/min via INTRAVENOUS

## 2018-09-24 MED ORDER — GABAPENTIN 300 MG PO CAPS
ORAL_CAPSULE | ORAL | Status: AC
  Administered 2018-09-24: 10:00:00 300 mg via ORAL
  Filled 2018-09-24: qty 1

## 2018-09-24 MED ORDER — ACETAMINOPHEN 325 MG PO TABS: 325.0000 mg | ORAL_TABLET | Freq: Four times a day (QID) | ORAL | Status: DC | PRN

## 2018-09-24 MED ORDER — ACETAMINOPHEN 500 MG PO TABS
ORAL_TABLET | ORAL | Status: AC
  Administered 2018-09-24: 10:00:00 1000 mg via ORAL
  Filled 2018-09-24: qty 2

## 2018-09-24 MED ORDER — GABAPENTIN 300 MG PO CAPS
300.0000 mg | ORAL_CAPSULE | Freq: Once | ORAL | Status: AC
  Administered 2018-09-24: 10:00:00 300 mg via ORAL

## 2018-09-24 MED ORDER — ALUM & MAG HYDROXIDE-SIMETH 200-200-20 MG/5ML PO SUSP: 30.0000 mL | ORAL | Status: DC | PRN

## 2018-09-24 MED ORDER — BACITRACIN 50000 UNITS IM SOLR
INTRAMUSCULAR | Status: AC
  Filled 2018-09-24: qty 2

## 2018-09-24 MED ORDER — SODIUM CHLORIDE FLUSH 0.9 % IV SOLN
INTRAVENOUS | Status: AC
  Filled 2018-09-24: qty 40

## 2018-09-24 MED ORDER — ONDANSETRON HCL 4 MG PO TABS
4.0000 mg | ORAL_TABLET | Freq: Four times a day (QID) | ORAL | Status: DC | PRN
  Administered 2018-09-25: 4 mg via ORAL
  Filled 2018-09-24: qty 1

## 2018-09-24 MED ORDER — POVIDONE-IODINE 10 % EX SWAB
2.0000 "application " | Freq: Once | CUTANEOUS | Status: AC
  Administered 2018-09-24: 2 via TOPICAL

## 2018-09-24 MED ORDER — BUPIVACAINE LIPOSOME 1.3 % IJ SUSP: 20.0000 mL | Freq: Once | INTRAMUSCULAR | Status: DC

## 2018-09-24 MED ORDER — MAGNESIUM HYDROXIDE 400 MG/5ML PO SUSP: 30.0000 mL | Freq: Every day | ORAL | Status: DC | PRN

## 2018-09-24 MED ORDER — SODIUM CHLORIDE FLUSH 0.9 % IV SOLN
INTRAVENOUS | Status: AC
  Filled 2018-09-24: qty 20

## 2018-09-24 MED ORDER — HYDROCODONE-ACETAMINOPHEN 7.5-325 MG PO TABS: 1.0000 | ORAL_TABLET | ORAL | Status: DC | PRN

## 2018-09-24 MED ORDER — GLYCOPYRROLATE 0.2 MG/ML IJ SOLN
INTRAMUSCULAR | Status: DC | PRN
  Administered 2018-09-24: 0.2 mg via INTRAVENOUS

## 2018-09-24 MED ORDER — FAMOTIDINE 20 MG PO TABS
20.0000 mg | ORAL_TABLET | Freq: Once | ORAL | Status: AC
  Administered 2018-09-24: 10:00:00 20 mg via ORAL

## 2018-09-24 MED ORDER — CEFAZOLIN SODIUM-DEXTROSE 2-4 GM/100ML-% IV SOLN
2.0000 g | Freq: Four times a day (QID) | INTRAVENOUS | Status: AC
  Administered 2018-09-24 – 2018-09-25 (×2): 2 g via INTRAVENOUS
  Filled 2018-09-24 (×2): qty 100

## 2018-09-24 MED ORDER — BUPIVACAINE HCL (PF) 0.5 % IJ SOLN
INTRAMUSCULAR | Status: DC | PRN
  Administered 2018-09-24: 2.5 mL

## 2018-09-24 MED ORDER — SODIUM CHLORIDE 0.9 % IR SOLN
Status: DC | PRN
  Administered 2018-09-24 (×2): 500 mL

## 2018-09-24 MED ORDER — DEXAMETHASONE SODIUM PHOSPHATE 10 MG/ML IJ SOLN
INTRAMUSCULAR | Status: AC
  Filled 2018-09-24: qty 1

## 2018-09-24 MED ORDER — ACETAMINOPHEN 500 MG PO TABS
500.0000 mg | ORAL_TABLET | Freq: Four times a day (QID) | ORAL | Status: AC
  Administered 2018-09-24 – 2018-09-25 (×4): 500 mg via ORAL
  Filled 2018-09-24 (×4): qty 1

## 2018-09-24 MED ORDER — MENTHOL 3 MG MT LOZG
1.0000 | LOZENGE | OROMUCOSAL | Status: DC | PRN
  Filled 2018-09-24: qty 9

## 2018-09-24 MED ORDER — ASPIRIN 81 MG PO CHEW
81.0000 mg | CHEWABLE_TABLET | Freq: Two times a day (BID) | ORAL | Status: DC
  Administered 2018-09-24 – 2018-09-25 (×2): 81 mg via ORAL
  Filled 2018-09-24 (×2): qty 1

## 2018-09-24 SURGICAL SUPPLY — 65 items
BASEPLATE TIBIAL SZ 3 (Knees) ×1 IMPLANT
BLADE SAW 18WX90L 1.27 THK (BLADE) ×3 IMPLANT
BLADE SAW SAG 25X90X1.19 (BLADE) ×3 IMPLANT
BOWL CEMENT MIX W/ADAPTER (MISCELLANEOUS) ×3 IMPLANT
BRUSH SCRUB EZ  4% CHG (MISCELLANEOUS) ×2
BRUSH SCRUB EZ 4% CHG (MISCELLANEOUS) ×1 IMPLANT
CANISTER SUCT 1200ML W/VALVE (MISCELLANEOUS) ×3 IMPLANT
CANISTER SUCT 3000ML PPV (MISCELLANEOUS) ×6 IMPLANT
CEMENT BONE 1-PACK (Cement) ×6 IMPLANT
CHLORAPREP W/TINT 26 (MISCELLANEOUS) ×6 IMPLANT
COMP PATELLA GENESIS 29 OVAL (Orthopedic Implant) ×3 IMPLANT
COMPONENT FEM OXIN SZ4 RT NRRW (Orthopedic Implant) ×3 IMPLANT
COMPONENT PTLLA GENS 29 OVAL (Orthopedic Implant) ×1 IMPLANT
COOLER POLAR GLACIER W/PUMP (MISCELLANEOUS) ×3 IMPLANT
COVER WAND RF STERILE (DRAPES) ×3 IMPLANT
CUFF TOURN SGL QUICK 30 (TOURNIQUET CUFF)
CUFF TOURN SGL QUICK 42 (TOURNIQUET CUFF) ×3 IMPLANT
CUFF TRNQT CYL 30X4X21-28X (TOURNIQUET CUFF) IMPLANT
DRAPE 3/4 80X56 (DRAPES) ×6 IMPLANT
DRAPE INCISE IOBAN 66X60 STRL (DRAPES) ×3 IMPLANT
ELECT REM PT RETURN 9FT ADLT (ELECTROSURGICAL) ×3
ELECTRODE REM PT RTRN 9FT ADLT (ELECTROSURGICAL) ×1 IMPLANT
GAUZE SPONGE 4X4 12PLY STRL (GAUZE/BANDAGES/DRESSINGS) ×3 IMPLANT
GAUZE XEROFORM 1X8 LF (GAUZE/BANDAGES/DRESSINGS) ×3 IMPLANT
GLOVE BIO SURGEON STRL SZ8 (GLOVE) ×3 IMPLANT
GLOVE BIOGEL PI IND STRL 8.5 (GLOVE) ×3 IMPLANT
GLOVE BIOGEL PI INDICATOR 8.5 (GLOVE) ×6
GLOVE INDICATOR 8.0 STRL GRN (GLOVE) ×3 IMPLANT
GLOVE SURG ORTHO 8.0 STRL STRW (GLOVE) ×9 IMPLANT
GOWN STRL REUS W/ TWL LRG LVL3 (GOWN DISPOSABLE) ×1 IMPLANT
GOWN STRL REUS W/ TWL XL LVL3 (GOWN DISPOSABLE) ×1 IMPLANT
GOWN STRL REUS W/TWL LRG LVL3 (GOWN DISPOSABLE) ×2
GOWN STRL REUS W/TWL XL LVL3 (GOWN DISPOSABLE) ×2
HANDLE YANKAUER SUCT BULB TIP (MISCELLANEOUS) ×3 IMPLANT
HOOD PEEL AWAY FLYTE STAYCOOL (MISCELLANEOUS) ×9 IMPLANT
INSERT ARTI HI FLEX 11 SZ 3-4 (Insert) ×3 IMPLANT
IV NS 1000ML (IV SOLUTION) ×2
IV NS 1000ML BAXH (IV SOLUTION) ×1 IMPLANT
KIT TURNOVER KIT A (KITS) ×3 IMPLANT
MAT ABSORB  FLUID 56X50 GRAY (MISCELLANEOUS) ×2
MAT ABSORB FLUID 56X50 GRAY (MISCELLANEOUS) ×1 IMPLANT
NDL SAFETY ECLIPSE 18X1.5 (NEEDLE) ×1 IMPLANT
NEEDLE HYPO 18GX1.5 SHARP (NEEDLE) ×2
NEEDLE SPNL 20GX3.5 QUINCKE YW (NEEDLE) ×3 IMPLANT
NS IRRIG 1000ML POUR BTL (IV SOLUTION) ×3 IMPLANT
PACK TOTAL KNEE (MISCELLANEOUS) ×3 IMPLANT
PAD DE MAYO PRESSURE PROTECT (MISCELLANEOUS) ×3 IMPLANT
PAD WRAPON POLAR KNEE (MISCELLANEOUS) IMPLANT
PAD WRAPON POLOR MULTI XL (MISCELLANEOUS) ×1 IMPLANT
PULSAVAC PLUS IRRIG FAN TIP (DISPOSABLE) ×3
STAPLER SKIN PROX 35W (STAPLE) ×3 IMPLANT
SUCTION FRAZIER HANDLE 10FR (MISCELLANEOUS) ×2
SUCTION TUBE FRAZIER 10FR DISP (MISCELLANEOUS) ×1 IMPLANT
SUT DVC 2 QUILL PDO  T11 36X36 (SUTURE) ×2
SUT DVC 2 QUILL PDO T11 36X36 (SUTURE) ×1 IMPLANT
SUT VIC AB 2-0 CT1 18 (SUTURE) ×3 IMPLANT
SUT VIC AB 2-0 CT1 27 (SUTURE)
SUT VIC AB 2-0 CT1 TAPERPNT 27 (SUTURE) IMPLANT
SUT VIC AB PLUS 45CM 1-MO-4 (SUTURE) ×3 IMPLANT
SYR 30ML LL (SYRINGE) ×9 IMPLANT
TIBIAL BASEPLATE SZ 3 (Knees) ×3 IMPLANT
TIP FAN IRRIG PULSAVAC PLUS (DISPOSABLE) ×1 IMPLANT
WRAP-ON POLOR PAD MULTI XL (MISCELLANEOUS) ×1
WRAPON POLAR PAD KNEE (MISCELLANEOUS)
WRAPON POLOR PAD MULTI XL (MISCELLANEOUS) ×2

## 2018-09-24 NOTE — H&P (Signed)
The patient has been re-examined, and the chart reviewed, and there have been no interval changes to the documented history and physical.  Plan a right total knee replacement today.  Anesthesia is consulted regarding a peripheral nerve block for post-operative pain.  The risks, benefits, and alternatives have been discussed at length, and the patient is willing to proceed.     

## 2018-09-24 NOTE — Anesthesia Procedure Notes (Signed)
Spinal  Start time: 09/24/2018 11:23 AM End time: 09/24/2018 11:29 AM Staffing Anesthesiologist: Molli Barrows, MD Resident/CRNA: Jerrye Noble, CRNA Performed: resident/CRNA  Preanesthetic Checklist Completed: patient identified, site marked, surgical consent, pre-op evaluation, timeout performed, IV checked, risks and benefits discussed and monitors and equipment checked Spinal Block Patient position: sitting Prep: ChloraPrep Patient monitoring: continuous pulse ox and blood pressure Approach: midline Location: L3-4 Injection technique: single-shot Needle Needle type: Quincke  Needle gauge: 22 G Assessment Sensory level: T10

## 2018-09-24 NOTE — Op Note (Signed)
DATE OF SURGERY:  09/24/2018 TIME: 1:27 PM  PATIENT NAME:  Kaitlyn Ponce   AGE: 68 y.o.    PRE-OPERATIVE DIAGNOSIS:  OSTEOARTHRITIS OF RIGHT KNEE  POST-OPERATIVE DIAGNOSIS:  Same  PROCEDURE:  Procedure(s): TOTAL KNEE ARTHROPLASTY - RIGHT,   SURGEON:  Lovell Sheehan, MD   ASSISTANT:  Carlynn Spry, PA-C  OPERATIVE IMPLANTS: Tamala [employer]hew, Cruciate Retaining Oxinium Femoral component size  4N, Fixed Bearing Tray size 3, Patella polyethylene 3-peg oval button size 29 mm, with an 11 mm HighFlex insert.   PREOPERATIVE INDICATIONS:  Kaitlyn Ponce is an 68 y.o. female who has a diagnosis of OSTEOARTHRITIS OF RIGHT KNEE and elected for a right total knee arthroplasty after failing nonoperative treatment, including activity modification, pain medication, physical therapy and injections who has significant impairment of their activities of daily living.  Radiographs have demonstrated tricompartmental osteoarthritis joint space narrowing, osteophytes, subchondral sclerosis and cyst formation.  The risks, benefits, and alternatives were discussed at length including but not limited to the risks of infection, bleeding, nerve or blood vessel injury, knee stiffness, fracture, dislocation, loosening or failure of the hardware and the need for further surgery. Medical risks include but not limited to DVT and pulmonary embolism, myocardial infarction, stroke, pneumonia, respiratory failure and death. I discussed these risks with the patient in my office prior to the date of surgery. They understood these risks and were willing to proceed.  OPERATIVE FINDINGS AND UNIQUE ASPECTS OF THE CASE:  All three compartments with advanced and severe degenerative changes, large osteophytes and an abundance of synovial fluid. Significant deformity was also noted. A decision was made to proceed with total knee arthroplasty.   OPERATIVE DESCRIPTION:  The patient was brought to the operative room and  placed in a supine position after undergoing placement of a general anesthetic. IV antibiotics were given. Patient received tranexamic acid. The lower extremity was prepped and draped in the usual sterile fashion.  A time out was performed to verify the patient's name, date of birth, medical record number, correct site of surgery and correct procedure to be performed. The timeout was also used to confirm the patient received antibiotics and that appropriate instruments, implants and radiographs studies were available in the room.  The leg was elevated and exsanguinated with an Esmarch and the tourniquet was inflated to 275 mmHg.  A midline incision was made over the left knee.. A medial parapatellar arthrotomy was then made and the patella subluxed laterally and the knee was brought into 90 of flexion. Hoffa's fat pad along with the anterior cruciate ligament was resected and the medial joint line was exposed.  Attention was then turned to preparation of the patella. The thickness of the patella was measured with a caliper, the diameter measured with the patella templates.  The patella resection was then made with an oscillating saw using the patella cutting guide.  The 29 mm button fit appropriately.  3 peg holes for the patella component were then drilled.  The extramedullary tibial cutting guide was then placed using the anterior tibial crest and second ray of the foot as a reference.  The tibial cutting guide was adjusted to allow for appropriate posterior slope.  The tibial cutting block was pinned into position. The slotted stylus was used to measure the proximal tibial resection of 9 mm off the high lateral side. Care was taken during the tibial resection to protect the medial and collateral ligaments.  The resected tibial bone was removed.  The  distal femur was resected using the Visionaire cutting guide.  Care was taken to protect the collateral ligaments during distal femoral resection.  The  distal femoral resection was performed with an oscillating saw. The femoral cutting guide was then removed. Extension gap was measured with a 11 mm spacer block and alignment and extension was confirmed using a long alignment rod. The femur was sized to be a 4. Rotation of the referencing guide was checked with the epicondylar axis and Whitesides line. Then the 4-in-1 cutting jig was then applied to the distal femur. A stylus was used to confirm that the anterior femur would not be notched.   Then the anterior, posterior and chamfer femoral cuts were then made with an oscillating saw.  The knee was distracted and all posterior osteophytes were removed.  The flexion gap was then measured with a flexion spacer block and long alignment rod and was found to be symmetric with the extension gap and perpendicular to mechanical axis of the tibia.  The proximal tibia plateau was then sized with trial trays. The best coverage was achieved with a size 3. This tibial tray was then pinned into position. The proximal tibia was then prepared with the keel punch.  After tibial preparation was completed, all trial components were inserted with polyethylene trials. The knee achieved full extension and flexed to 120 degrees. Ligament were stable to varus and valgus at full extension as well as 30, 60 and 90 degrees of flexion.   The trials were then placed. Knee was taken through a full range of motion and deemed to be stable with the trial components. All trial components were then removed.  The joint was copiously irrigated with pulse lavage.  The final total knee arthroplasty components were then cemented into place. The knee was held in extension while cement was allowed to cure.The knee was taken through a range of motion and the patella tracked well and the knee was again irrigated copiously.  The knee capsule was then injected with Exparel.  The medial arthrotomy was closed with #1 Vicryl and #2 Quill. The subcutaneous  tissue closed with  2-0 vicryl, and skin approximated with staples.  A dry sterile and compressive dressing was applied.  A Polar Care was applied to the operative knee.  The patient was awakened and brought to the PACU in stable and satisfactory condition.  All sharp, lap and instrument counts were correct at the conclusion the case. I spoke with the patient's family in the postop consultation room to let them know the case had been performed without complication and the patient was stable in recovery room.   Total tourniquet time was 45 minutes.

## 2018-09-24 NOTE — Anesthesia Post-op Follow-up Note (Signed)
Anesthesia QCDR form completed.        

## 2018-09-24 NOTE — Transfer of Care (Signed)
Immediate Anesthesia Transfer of Care Note  Patient: Kaitlyn Ponce  Procedure(s) Performed: TOTAL KNEE ARTHROPLASTY - RIGHT (Right )  Patient Location: PACU  Anesthesia Type:Spinal  Level of Consciousness: awake and alert   Airway & Oxygen Therapy: Patient Spontanous Breathing and Patient connected to face mask oxygen  Post-op Assessment: Report given to RN and Post -op Vital signs reviewed and stable  Post vital signs: Reviewed and stable  Last Vitals:  Vitals Value Taken Time  BP 106/62 09/24/18 1330  Temp    Pulse 45 09/24/18 1336  Resp 12 09/24/18 1336  SpO2 100 % 09/24/18 1336  Vitals shown include unvalidated device data.  Last Pain:  Vitals:   09/24/18 1329  TempSrc:   PainSc: (P) Asleep         Complications: No apparent anesthesia complications

## 2018-09-24 NOTE — Anesthesia Procedure Notes (Signed)
Spinal

## 2018-09-24 NOTE — Anesthesia Preprocedure Evaluation (Addendum)
Anesthesia Evaluation  Patient identified by MRN, date of birth, ID band Patient awake    Reviewed: Allergy & Precautions, H&P , NPO status , Patient's Chart, lab work & pertinent test results, reviewed documented beta blocker date and time   History of Anesthesia Complications (+) PONV and history of anesthetic complications  Airway Mallampati: II   Neck ROM: full    Dental  (+) Poor Dentition   Pulmonary neg pulmonary ROS, shortness of breath and with exertion,    Pulmonary exam normal        Cardiovascular Exercise Tolerance: Poor negative cardio ROS Normal cardiovascular exam Rhythm:regular Rate:Normal     Neuro/Psych negative neurological ROS  negative psych ROS   GI/Hepatic negative GI ROS, Neg liver ROS, GERD  Medicated,  Endo/Other  negative endocrine ROS  Renal/GU negative Renal ROS  negative genitourinary   Musculoskeletal   Abdominal   Peds  Hematology negative hematology ROS (+)   Anesthesia Other Findings Past Medical History: No date: Allergy     Comment:  cat, dust No date: Arthritis No date: Dyspnea No date: GERD (gastroesophageal reflux disease) No date: High cholesterol     Comment:  taking medication, declined nutritional referral at this              time 10/11/17 No date: History of tonsillectomy No date: PONV (postoperative nausea and vomiting) Past Surgical History: 2010: BREAST BIOPSY; Left     Comment:  [place] core 07/01/2018: PAROTIDECTOMY; N/A     Comment:  Procedure: PAROTIDECTOMY;  Surgeon: Carloyn Manner,               MD;  Location: [facility] ORS;  Service: ENT;  Laterality: N/A; No date: TONSILLECTOMY 1987: TUBAL LIGATION 2002: VAGINAL HYSTERECTOMY BMI    Body Mass Index: 42.74 kg/m     Reproductive/Obstetrics negative OB ROS                            Anesthesia Physical Anesthesia Plan  ASA: III  Anesthesia Plan: Spinal   Post-op Pain  Management:  Regional for Post-op pain   Induction:   PONV Risk Score and Plan:   Airway Management Planned:   Additional Equipment:   Intra-op Plan:   Post-operative Plan:   Informed Consent: I have reviewed the patients History and Physical, chart, labs and discussed the procedure including the risks, benefits and alternatives for the proposed anesthesia with the patient or authorized representative who has indicated his/her understanding and acceptance.     Dental Advisory Given  Plan Discussed with: CRNA  Anesthesia Plan Comments:        Anesthesia Quick Evaluation

## 2018-09-25 ENCOUNTER — Encounter: Payer: Self-pay | Admitting: Orthopedic Surgery

## 2018-09-25 DIAGNOSIS — M1711 Unilateral primary osteoarthritis, right knee: Secondary | ICD-10-CM | POA: Diagnosis not present

## 2018-09-25 LAB — BASIC METABOLIC PANEL
Anion gap: 8 (ref 5–15)
BUN: 11 mg/dL (ref 8–23)
CO2: 25 mmol/L (ref 22–32)
Calcium: 8.6 mg/dL — ABNORMAL LOW (ref 8.9–10.3)
Chloride: 107 mmol/L (ref 98–111)
Creatinine, Ser: 0.94 mg/dL (ref 0.44–1.00)
GFR calc Af Amer: >60 mL/min (ref >60)
GFR calc non Af Amer: >60 mL/min (ref >60)
Glucose, Bld: 171 mg/dL — ABNORMAL HIGH (ref 70–99)
Potassium: 4.4 mmol/L (ref 3.5–5.1)
Sodium: 140 mmol/L (ref 135–145)

## 2018-09-25 LAB — CBC
HCT: 35.4 % — ABNORMAL LOW (ref 36.0–46.0)
Hemoglobin: 11.5 g/dL — ABNORMAL LOW (ref 12.0–15.0)
MCH: 29.6 pg (ref 26.0–34.0)
MCHC: 32.5 g/dL (ref 30.0–36.0)
MCV: 91 fL (ref 80.0–100.0)
Platelets: 168 10*3/uL (ref 150–400)
RBC: 3.89 MIL/uL (ref 3.87–5.11)
RDW: 13.1 % (ref 11.5–15.5)
WBC: 11.7 10*3/uL — ABNORMAL HIGH (ref 4.0–10.5)
nRBC: 0 % (ref 0.0–0.2)

## 2018-09-25 MED ORDER — SODIUM CHLORIDE 0.9 % IV BOLUS
250.0000 mL | Freq: Once | INTRAVENOUS | Status: AC
  Administered 2018-09-25: 250 mL via INTRAVENOUS

## 2018-09-25 MED ORDER — ASPIRIN 81 MG PO CHEW: 81.0000 mg | CHEWABLE_TABLET | Freq: Two times a day (BID) | ORAL | 0 refills | Status: AC

## 2018-09-25 MED ORDER — DOCUSATE SODIUM 100 MG PO CAPS: 100.0000 mg | ORAL_CAPSULE | Freq: Two times a day (BID) | ORAL | 0 refills | Status: AC

## 2018-09-25 MED ORDER — SODIUM CHLORIDE 0.9 % IV BOLUS
500.0000 mL | Freq: Once | INTRAVENOUS | Status: AC
  Administered 2018-09-25: 500 mL via INTRAVENOUS

## 2018-09-25 MED ORDER — HYDROCODONE-ACETAMINOPHEN 5-325 MG PO TABS: 1.0000 | ORAL_TABLET | ORAL | 0 refills | Status: AC | PRN

## 2018-09-25 NOTE — Progress Notes (Signed)
Physical Therapy Treatment Patient Details Name: Kaitlyn Ponce MRN: [id number] DOB: 1950-09-05 Today's Date: 09/25/2018    History of Present Illness admitted for acute hospitalization status post R TKR, WBAT (09/24/18).    PT Comments    Significant improvement in BP and subsequent activity tolerance this PM.  Tolerating gait up to 130' with RW, cga/close sup and stairs (up/down 4 x2 with bilat rails), cga.  Good, functional ROM to R knee and goo stability of R knee, progressive comfort/confidence in movement.  Patient encouraged by progress and hopeful for discharge home.   Follow Up Recommendations  Outpatient PT     Equipment Recommendations  Rolling walker with 5" wheels;3in1 (PT)    Recommendations for Other Services       Precautions / Restrictions Precautions Precautions: Fall Restrictions Weight Bearing Restrictions: Yes RLE Weight Bearing: Weight bearing as tolerated    Mobility  Bed Mobility Overal bed mobility: Modified Independent             General bed mobility comments: able to mobilize R LE over edge of bed indep  Transfers Overall transfer level: Needs assistance Equipment used: Rolling walker (2 wheeled) Transfers: Sit to/from Stand Sit to Stand: Supervision         General transfer comment: good carry-over of technique for sit/stand, does require use of UEs to assist with eccentric control during stand to sit  Ambulation/Gait Ambulation/Gait assistance: Supervision;+2 safety/equipment Gait Distance (Feet): 130 Feet(x2) Assistive device: Rolling walker (2 wheeled)       General Gait Details: reciprocal stepping pattern with progressive increase in R LE stance time; mild guarding with decreased R LE heel strike/toe off and knee flex/ext throughout gait cycle.   Stairs Stairs: Yes Stairs assistance: Min guard Stair Management: Two rails Number of Stairs: 4(x2) General stair comments: step to gait pattern, ascending with L LE,  descending with R LE.  Prefers to descend in slightly rotated position (towards L) for optimal comfort/control   Wheelchair Mobility    Modified Rankin (Stroke Patients Only)       Balance Overall balance assessment: Needs assistance Sitting-balance support: No upper extremity supported;Feet supported Sitting balance-Leahy Scale: Good     Standing balance support: Bilateral upper extremity supported Standing balance-Leahy Scale: Fair                              Cognition Arousal/Alertness: Awake/alert Behavior During Therapy: WFL for tasks assessed/performed Overall Cognitive Status: Within Functional Limits for tasks assessed                                        Exercises Other Exercises Other Exercises: Toilet transfer, ambulatory with RW, cga/close sup; sit/stand from bSC with RW, cga/close sup; standing balance for hygiene, hand hygiene, close sup Reviewed polar care set up/use and general activity recommendations upon return home; reviewed car transfer technique.  Patient voiced understanding of all information.   General Comments        Pertinent Vitals/Pain Pain Assessment: 0-10 Pain Score: 5  Pain Location: R knee Pain Descriptors / Indicators: Aching;Guarding;Grimacing Pain Intervention(s): Monitored during session;Limited activity within patient's tolerance;Premedicated before session;Repositioned    Home Living                      Prior Function  PT Goals (current goals can now be found in the care plan section) Acute Rehab PT Goals Patient Stated Goal: to return home today if able PT Goal Formulation: With patient Time For Goal Achievement: 10/09/18 Potential to Achieve Goals: Good Progress towards PT goals: Progressing toward goals    Frequency    BID      PT Plan Current plan remains appropriate    Co-evaluation              AM-PAC PT "6 Clicks" Mobility   Outcome Measure   Help needed turning from your back to your side while in a flat bed without using bedrails?: None Help needed moving from lying on your back to sitting on the side of a flat bed without using bedrails?: None Help needed moving to and from a bed to a chair (including a wheelchair)?: None Help needed standing up from a chair using your arms (e.g., wheelchair or bedside chair)?: None Help needed to walk in hospital room?: None Help needed climbing 3-5 steps with a railing? : A Little 6 Click Score: 23    End of Session Equipment Utilized During Treatment: Gait belt Activity Tolerance: Patient tolerated treatment well Patient left: with call bell/phone within reach;in bed;with bed alarm set Nurse Communication: Mobility status PT Visit Diagnosis: Muscle weakness (generalized) (M62.81);Difficulty in walking, not elsewhere classified (R26.2);Pain Pain - Right/Left: Right Pain - part of body: Knee     Time: 1400-1453 PT Time Calculation (min) (ACUTE ONLY): 53 min  Charges:  $Gait Training: 23-37 mins $Therapeutic Activity: 8-22 mins                     Wylma Tatem H. Owens Shark, PT, DPT, NCS 09/25/18, 9:01 PM [phone]

## 2018-09-25 NOTE — Evaluation (Signed)
Physical Therapy Evaluation Patient Details Name: Kaitlyn Ponce MRN: [id number] DOB: 1950-07-26 Today's Date: 09/25/2018   History of Present Illness  admitted for acute hospitalization status post R TKR, WBAT (09/24/18).  Clinical Impression  Upon evaluation, patient alert and oriented; follows commands, eager for OOB activities. Rates R knee pain 3-4/10 at rest, 7-8/10 with therex and WBing.  Fair/good post op strength (3-/5) and ROM (0-90 degrees) noted in R LE, limited by pain and post-op dressing.  Good effort and muscle activation with isolated therex, but difficulty with R quad activation in against-gravity activities.  Able to complete bed mobility with close sup; sit/stand, basic transfers and very short-distance gait (5' x2) with RW, cga/min assist.  Fair WBing/stance time R LE; good stability and overall knee control; no buckling or LOB.  However, did note significant drop in BP With transition to upright (symptomatic); additional gait distance deferred as result.  RN informed/aware of BP management.  Will continue to assess/progress as appropriate. Would benefit from skilled PT to address above deficits and promote optimal return to PLOF; recommend transition to home with outpatient physical therapy as appropriate.    Follow Up Recommendations Home health PT    Equipment Recommendations  Rolling walker with 5" wheels    Recommendations for Other Services       Precautions / Restrictions Precautions Precautions: Fall Restrictions Weight Bearing Restrictions: Yes RLE Weight Bearing: Weight bearing as tolerated      Mobility  Bed Mobility Overal bed mobility: Needs Assistance Bed Mobility: Supine to Sit     Supine to sit: Supervision     General bed mobility comments: use of bedrails to complete  Transfers Overall transfer level: Needs assistance Equipment used: Rolling walker (2 wheeled) Transfers: Sit to/from Stand Sit to Stand: Min guard;Min assist          General transfer comment: cuing for hand placement; fair WBing R LE  Ambulation/Gait Ambulation/Gait assistance: Min guard;Min assist Gait Distance (Feet): 5 Feet Assistive device: Rolling walker (2 wheeled)       General Gait Details: 3-point, step to gait pattern with decreased stance time R LE; heavy WBing bilat UEs. Onset of lightheadedness with transition to upright, requiring returnt o seating position for recovery. Significant orthostasis.  Stairs            Wheelchair Mobility    Modified Rankin (Stroke Patients Only)       Balance Overall balance assessment: Needs assistance Sitting-balance support: No upper extremity supported;Feet supported Sitting balance-Leahy Scale: Good     Standing balance support: Bilateral upper extremity supported Standing balance-Leahy Scale: Fair                               Pertinent Vitals/Pain Pain Assessment: 0-10 Pain Score: 4  Pain Location: R knee Pain Descriptors / Indicators: Aching;Guarding;Grimacing    Home Living Family/patient expects to be discharged to:: Private residence Living Arrangements: Spouse/significant other Available Help at Discharge: Family;Available 24 hours/day Type of Home: House Home Access: Stairs to enter Entrance Stairs-Rails: Right Entrance Stairs-Number of Steps: 2 Home Layout: One level;Two level;Able to live on main level with bedroom/bathroom Home Equipment: None      Prior Function Level of Independence: Independent         Comments: Indep with ADLs, household and community mobilization     Hand Dominance        Extremity/Trunk Assessment   Upper Extremity Assessment Upper Extremity  Assessment: Overall WFL for tasks assessed    Lower Extremity Assessment Lower Extremity Assessment: (R knee grossly 3-/5, limited by pain; otherwise, grossly WFL)       Communication      Cognition Arousal/Alertness: Awake/alert Behavior During Therapy: WFL for  tasks assessed/performed Overall Cognitive Status: Within Functional Limits for tasks assessed                                        General Comments      Exercises Total Joint Exercises Goniometric ROM: R knee: 0-90 degrees, limited by pain and post-op dressing Other Exercises Other Exercises: Supine R LE therex, 1x10, act assist ROM: ankle pumps, quad sets, SAQs, heel slides, hip abduct/adduct and SLR.  Mild difficulty with quad activation against gravity. Other Exercises: Toilet transfer, SPT with Rw, cga/min assist; sit/stand from [facility] wtih RW, cga/min assist; standing balance for hygiene, cga/close sup with RW.   Assessment/Plan    PT Assessment Patient needs continued PT services  PT Problem List Decreased strength;Decreased range of motion;Decreased activity tolerance;Decreased balance;Decreased mobility;Decreased coordination;Decreased cognition;Decreased knowledge of use of DME;Decreased safety awareness;Decreased knowledge of precautions;Obesity;Decreased skin integrity;Pain       PT Treatment Interventions DME instruction;Gait training;Stair training;Functional mobility training;Therapeutic activities;Therapeutic exercise;Balance training;Patient/family education    PT Goals (Current goals can be found in the Care Plan section)  Acute Rehab PT Goals Patient Stated Goal: to return home today if able PT Goal Formulation: With patient Time For Goal Achievement: 10/09/18 Potential to Achieve Goals: Good    Frequency BID   Barriers to discharge        Co-evaluation               AM-PAC PT "6 Clicks" Mobility  Outcome Measure Help needed turning from your back to your side while in a flat bed without using bedrails?: None Help needed moving from lying on your back to sitting on the side of a flat bed without using bedrails?: None Help needed moving to and from a bed to a chair (including a wheelchair)?: A Little Help needed standing up from a  chair using your arms (e.g., wheelchair or bedside chair)?: A Little Help needed to walk in hospital room?: A Little Help needed climbing 3-5 steps with a railing? : A Lot 6 Click Score: 19    End of Session Equipment Utilized During Treatment: Gait belt Activity Tolerance: Patient tolerated treatment well Patient left: in chair;with call bell/phone within reach;with chair alarm set Nurse Communication: Mobility status PT Visit Diagnosis: Muscle weakness (generalized) (M62.81);Difficulty in walking, not elsewhere classified (R26.2);Pain Pain - Right/Left: Right Pain - part of body: Knee    Time: [id number] PT Time Calculation (min) (ACUTE ONLY): 41 min   Charges:   PT Evaluation $PT Eval Moderate Complexity: 1 Mod PT Treatments $Therapeutic Exercise: 8-22 mins $Therapeutic Activity: 8-22 mins        Jaze Rodino H. Owens Shark, PT, DPT, NCS 09/25/18, 11:36 AM [phone]

## 2018-09-25 NOTE — Progress Notes (Signed)
Discharge summary reviewed with verbal understanding. Answered all questions. Aquacell dressing applied per order. Instructions to call office in am to set appt. Etc. Stable upon discharge. Escorted to personal vehicle.

## 2018-09-25 NOTE — Progress Notes (Signed)
Alerted MD for positive orthostatics, new orders for 500cc bolus x 1hr

## 2018-09-25 NOTE — Anesthesia Postprocedure Evaluation (Signed)
Anesthesia Post Note  Patient: Kaitlyn Ponce  Procedure(s) Performed: TOTAL KNEE ARTHROPLASTY - RIGHT (Right )  Patient location during evaluation: Nursing Unit Anesthesia Type: Spinal Level of consciousness: awake, awake and alert and oriented Pain management: pain level controlled Vital Signs Assessment: post-procedure vital signs reviewed and stable Respiratory status: spontaneous breathing, nonlabored ventilation and respiratory function stable Cardiovascular status: blood pressure returned to baseline and stable Postop Assessment: no headache and no backache Anesthetic complications: no Comments: Pt states I could hear all of the conversation during the surgery. Pt informed with a spinal you are in a twilight sleep so there is the possibility of hearing what is going on. Pt states I'm ok with it I just wish I would have known this ahead of time.     Last Vitals:  Vitals:   09/25/18 0409 09/25/18 0612  BP: (!) 98/48 (!) 117/51  Pulse: (!) 50 (!) 54  Resp:    Temp:    SpO2:      Last Pain:  Vitals:   09/25/18 0758  TempSrc:   PainSc: 3                  Proofreader

## 2018-09-25 NOTE — TOC Transition Note (Signed)
Transition of Care [facility]) - CM/SW Discharge Note   Patient Details  Name: Kaitlyn Ponce MRN: [id number] Date of Birth: September 08, 1950  Transition of Care [facility]) CM/SW Contact:  Su Hilt, RN Phone Number: 09/25/2018, 11:51 AM   Clinical Narrative:     Met with the patient to discuss DC plan and needs She lives at home with her spouse and he provides transportation, she is up to date with PCP and she can afford her medications She will be doing Outpatient PT and has appointment She has a RW and cane at home but would benefit from 3 in 1, I notified Brad from [employer] No other needs    Final next level of care: Home/Self Care Barriers to Discharge: Barriers Resolved   Patient Goals and CMS Choice Patient states their goals for this hospitalization and ongoing recovery are:: go home      Discharge Placement                       Discharge Plan and Services   Discharge Planning Services: CM Consult            DME Arranged: 3-N-1 DME Agency: [employer] Date DME Agency Contacted: 09/25/18 Time DME Agency Contacted: 43 Representative spoke with at DME Agency: Leroy Sea HH Arranged: NA          Social Determinants of Health ([place]) Interventions     Readmission Risk Interventions No flowsheet data found.

## 2018-09-25 NOTE — Discharge Instructions (Signed)
Continue weight bear as tolerated on the right lower extremity.    Elevate the right lower extremity whenever possible and continue the polar care while elevating the extremity. Patient may shower. No bath or submerging the wound.    Take aspirin as directed for blood clot prevention.  Continue to work on knee range of motion exercises at home as instructed by physical therapy. Continue to use a walker for assistance with ambulation until cleared by physical therapy.  Call [phone] with any questions, such as fever > 101.5 degrees, drainage from the wound or shortness of breath.  

## 2018-09-25 NOTE — Discharge Summary (Signed)
Physician Discharge Summary  Patient ID: Kaitlyn Ponce MRN: [id number] DOB/AGE: [birth year] 68 y.o.  Admit date: 09/24/2018 Discharge date: 09/25/2018  Admission Diagnoses:  OSTEOARTHRITIS OF RIGHT KNEE <principal problem not specified>  Discharge Diagnoses:  OSTEOARTHRITIS OF RIGHT KNEE Active Problems:   S/P TKR (total knee replacement) using cement, right   Past Medical History:  Diagnosis Date  . Allergy    cat, dust  . Arthritis   . Dyspnea   . GERD (gastroesophageal reflux disease)   . High cholesterol    taking medication, declined nutritional referral at this time 10/11/17  . History of tonsillectomy   . PONV (postoperative nausea and vomiting)     Surgeries: Procedure(s): TOTAL KNEE ARTHROPLASTY - RIGHT on 09/24/2018   Consultants (if any):   Discharged Condition: Improved  Hospital Course: Kaitlyn Ponce is an 68 y.o. female who was admitted 09/24/2018 with a diagnosis of  OSTEOARTHRITIS OF RIGHT KNEE <principal problem not specified> and went to the operating room on 09/24/2018 and underwent the above named procedures.    She was given perioperative antibiotics:  Anti-infectives (From admission, onward)   Start     Dose/Rate Route Frequency Ordered Stop   09/24/18 1800  ceFAZolin (ANCEF) IVPB 2g/100 mL premix     2 g 200 mL/hr over 30 Minutes Intravenous Every 6 hours 09/24/18 1616 09/25/18 0221   09/24/18 1202  50,000 units bacitracin in 0.9% normal saline 250 mL irrigation  Status:  Discontinued       As needed 09/24/18 1202 09/24/18 1325   09/24/18 0945  ceFAZolin (ANCEF) 2-4 GM/100ML-% IVPB    Note to Pharmacy: Kaitlyn Ponce   : cabinet override      09/24/18 0945 09/24/18 1140   09/24/18 0600  ceFAZolin (ANCEF) IVPB 2g/100 mL premix     2 g 200 mL/hr over 30 Minutes Intravenous On call to O.R. 09/23/18 2236 09/24/18 1155    .  She was given sequential compression devices, early ambulation, and aspirin for DVT prophylaxis.  She benefited  maximally from the hospital stay and there were no complications.    Recent vital signs:  Vitals:   09/25/18 0409 09/25/18 0612  BP: (!) 98/48 (!) 117/51  Pulse: (!) 50 (!) 54  Resp:    Temp:    SpO2:      Recent laboratory studies:  Lab Results  Component Value Date   HGB 11.5 (L) 09/25/2018   HGB 14.9 08/29/2018   HGB 15.3 (H) 04/09/2018   Lab Results  Component Value Date   WBC 11.7 (H) 09/25/2018   PLT 168 09/25/2018   Lab Results  Component Value Date   INR 1.0 08/29/2018   Lab Results  Component Value Date   NA 140 09/25/2018   K 4.4 09/25/2018   CL 107 09/25/2018   CO2 25 09/25/2018   BUN 11 09/25/2018   CREATININE 0.94 09/25/2018   GLUCOSE 171 (H) 09/25/2018    Discharge Medications:   Allergies as of 09/25/2018      Reactions   Sulfa Antibiotics Rash      Medication List    STOP taking these medications   acetaminophen 325 MG tablet Commonly known as: TYLENOL     TAKE these medications   Alcortin A 1-2-1 % Gel Apply 1 application topically 2 (two) times daily as needed (rash/irritated skin.).   aspirin 81 MG chewable tablet Chew 1 tablet (81 mg total) by mouth 2 (two) times daily.   calcium carbonate 500  MG chewable tablet Commonly known as: TUMS - dosed in mg elemental calcium Chew 1 tablet by mouth at bedtime as needed for indigestion or heartburn.   docusate sodium 100 MG capsule Commonly known as: COLACE Take 1 capsule (100 mg total) by mouth 2 (two) times daily.   HYDROcodone-acetaminophen 5-325 MG tablet Commonly known as: NORCO/VICODIN Take 1 tablet by mouth every 4 (four) hours as needed for moderate pain (pain score 4-6).   pravastatin 20 MG tablet Commonly known as: PRAVACHOL Take 20 mg by mouth at bedtime.       Diagnostic Studies: Dg Knee Right Port  Result Date: 09/24/2018 CLINICAL DATA:  Postop knee replacement EXAM: PORTABLE RIGHT KNEE - 1-2 VIEW COMPARISON:  12/12/2007 FINDINGS: Status post right knee replacement  with intact hardware and normal alignment. Gas within the soft tissues and joint space consistent with recent surgery. Cutaneous staples over the knee. No fracture IMPRESSION: Status post right knee replacement with expected postsurgical changes Electronically Signed   By: Donavan Foil M.D.   On: 09/24/2018 14:42    Disposition: Discharge disposition: 01-Home or Self Care            Signed: Lovell Sheehan ,MD 09/25/2018, 10:09 AM

## 2018-09-25 NOTE — Progress Notes (Signed)
250 NaCl Bolus ordered and administered.

## 2018-09-25 NOTE — Progress Notes (Signed)
MEWS 2 d/t low BP. Pt. Asymptomatic, NAD. Md Mack Guise and Rapid Response nurse [place] notified. VS and monitoring per MEWS protocol. Will continue to monitor pt  MEWS Guidelines - (patients age 67 and over)   Yellow - At risk for Deterioration   1. Go to room and assess patient 2. Validate data. Is this patient's baseline? If data confirmed: 3. Is this an acute change? 4. Administer prn meds/treatments as ordered? 5. Note Sepsis score 6. Review goals of care 7. Sports coach and Provider 8. Call RRT nurse as needed. 9. Document patient condition/interventions/response. 10. Increase frequency of vital signs and focused assessments to at least q2h x2. - If stable, then q4h x2 and then q8h or dept. routine. - If unstable, contact Provider & RRT nurse. Prepare for possible transfer. 11. Add entry in progress notes using the smart phrase ".MEWS".

## 2018-10-02 NOTE — H&P (Signed)
PREOPERATIVE H&P  Chief Complaint: OSTEOARTHRITIS OF RIGHT KNEE  HPI: Kaitlyn Ponce is a 68 y.o. female who presents for preoperative history and physical with a diagnosis of OSTEOARTHRITIS OF RIGHT KNEE. Symptoms are rated as moderate to severe, and have been worsening.  This is significantly impairing activities of daily living.  She has elected for surgical management.   Past Medical History:  Diagnosis Date  . Allergy    cat, dust  . Arthritis   . Dyspnea   . GERD (gastroesophageal reflux disease)   . High cholesterol    taking medication, declined nutritional referral at this time 10/11/17  . History of tonsillectomy   . PONV (postoperative nausea and vomiting)    Past Surgical History:  Procedure Laterality Date  . BREAST BIOPSY Left 2010   [place] core  . PAROTIDECTOMY N/A 07/01/2018   Procedure: PAROTIDECTOMY;  Surgeon: Carloyn Manner, MD;  Location: [facility] ORS;  Service: ENT;  Laterality: N/A;  . TONSILLECTOMY    . TOTAL KNEE ARTHROPLASTY Right 09/24/2018   Procedure: TOTAL KNEE ARTHROPLASTY - RIGHT;  Surgeon: Lovell Sheehan, MD;  Location: [facility] ORS;  Service: Orthopedics;  Laterality: Right;  . TUBAL LIGATION  1987  . VAGINAL HYSTERECTOMY  2002   Social History   Socioeconomic History  . Marital status: Married    Spouse name: Not on file  . Number of children: Not on file  . Years of education: Not on file  . Highest education level: Not on file  Occupational History  . Not on file  Social Needs  . Financial resource strain: Not hard at all  . Food insecurity    Worry: Never true    Inability: Never true  . Transportation needs    Medical: Yes    Non-medical: Yes  Tobacco Use  . Smoking status: Never Smoker  . Smokeless tobacco: Never Used  Substance and Sexual Activity  . Alcohol use: Not Currently    Frequency: Never  . Drug use: Not Currently  . Sexual activity: Yes    Birth control/protection: Surgical  Lifestyle  . Physical activity    Days  per week: Not on file    Minutes per session: Not on file  . Stress: Not at all  Relationships  . Social connections    Talks on phone: More than three times a week    Gets together: Not on file    Attends religious service: Not on file    Active member of club or organization: Not on file    Attends meetings of clubs or organizations: Not on file    Relationship status: Not on file  Other Topics Concern  . Not on file  Social History Narrative  . Not on file   Family History  Problem Relation Age of Onset  . Breast cancer Neg Hx    Allergies  Allergen Reactions  . Sulfa Antibiotics Rash   Prior to Admission medications   Medication Sig Start Date End Date Taking? Authorizing Provider  calcium carbonate (TUMS - DOSED IN MG ELEMENTAL CALCIUM) 500 MG chewable tablet Chew 1 tablet by mouth at bedtime as needed for indigestion or heartburn.   Yes [provider]  Iodoquinol-HC-Aloe Polysacch (ALCORTIN A) 1-2-1 % GEL Apply 1 application topically 2 (two) times daily as needed (rash/irritated skin.).    Yes [provider]  pravastatin (PRAVACHOL) 20 MG tablet Take 20 mg by mouth at bedtime.    Yes [provider]  aspirin 81  MG chewable tablet Chew 1 tablet (81 mg total) by mouth 2 (two) times daily. 09/25/18   Lovell Sheehan, MD  docusate sodium (COLACE) 100 MG capsule Take 1 capsule (100 mg total) by mouth 2 (two) times daily. 09/25/18   Lovell Sheehan, MD  HYDROcodone-acetaminophen (NORCO/VICODIN) 5-325 MG tablet Take 1 tablet by mouth every 4 (four) hours as needed for moderate pain (pain score 4-6). 09/25/18   Lovell Sheehan, MD     Positive ROS: All other systems have been reviewed and were otherwise negative with the exception of those mentioned in the HPI and as above.  Physical Exam: General: Alert, no acute distress Cardiovascular: Regular rate and rhythm, no murmurs rubs or gallops.  No pedal edema Respiratory: Clear to auscultation bilaterally,  no wheezes rales or rhonchi. No cyanosis, no use of accessory musculature GI: No organomegaly, abdomen is soft and non-tender nondistended with positive bowel sounds. Skin: Skin intact, no lesions within the operative field. Neurologic: Sensation intact distally Psychiatric: Patient is competent for consent with normal mood and affect Lymphatic: No axillary or cervical lymphadenopathy  MUSCULOSKELETAL: right knee medial and lateral joint line tenderness, +effusion, motor and sensory intact  Assessment: OSTEOARTHRITIS OF RIGHT KNEE  Plan: Plan for Procedure(s): TOTAL KNEE ARTHROPLASTY - RIGHT  I discussed the risks and benefits of surgery. The risks include but are not limited to infection, bleeding requiring blood transfusion, nerve or blood vessel injury, joint stiffness or loss of motion, persistent pain, weakness or instability, malunion, nonunion and hardware failure and the need for further surgery. Medical risks include but are not limited to DVT and pulmonary embolism, myocardial infarction, stroke, pneumonia, respiratory failure and death. Patient understood these risks and wished to proceed.   Lovell Sheehan, MD   10/02/2018 12:04 PM

## 2018-10-29 ENCOUNTER — Other Ambulatory Visit: Payer: Self-pay | Admitting: Family Medicine

## 2018-10-29 DIAGNOSIS — Z1231 Encounter for screening mammogram for malignant neoplasm of breast: Secondary | ICD-10-CM

## 2018-12-10 ENCOUNTER — Ambulatory Visit
Admission: RE | Admit: 2018-12-10 | Discharge: 2018-12-10 | Disposition: A | Discharge status: Home / Self Care | Payer: Medicare Other | Source: Ambulatory Visit | Attending: Family Medicine | Admitting: Family Medicine

## 2018-12-10 ENCOUNTER — Other Ambulatory Visit: Payer: Self-pay

## 2018-12-10 ENCOUNTER — Encounter (INDEPENDENT_AMBULATORY_CARE_PROVIDER_SITE_OTHER): Payer: Self-pay

## 2018-12-10 DIAGNOSIS — Z1231 Encounter for screening mammogram for malignant neoplasm of breast: Secondary | ICD-10-CM | POA: Diagnosis not present

## 2019-11-17 ENCOUNTER — Other Ambulatory Visit: Payer: Self-pay | Admitting: Family Medicine

## 2019-11-17 DIAGNOSIS — Z1231 Encounter for screening mammogram for malignant neoplasm of breast: Secondary | ICD-10-CM

## 2020-02-12 ENCOUNTER — Other Ambulatory Visit: Payer: Self-pay

## 2020-02-12 ENCOUNTER — Ambulatory Visit
Admission: RE | Admit: 2020-02-12 | Discharge: 2020-02-12 | Disposition: A | Discharge status: Home / Self Care | Payer: Medicare Other | Source: Ambulatory Visit | Attending: Family Medicine | Admitting: Family Medicine

## 2020-02-12 DIAGNOSIS — Z1231 Encounter for screening mammogram for malignant neoplasm of breast: Secondary | ICD-10-CM | POA: Diagnosis not present

## 2020-08-19 ENCOUNTER — Encounter: Payer: Self-pay | Admitting: *Deleted

## 2020-08-19 ENCOUNTER — Ambulatory Visit: Payer: Medicare Other | Admitting: Anesthesiology

## 2020-08-19 ENCOUNTER — Ambulatory Visit
Admission: RE | Admit: 2020-08-19 | Discharge: 2020-08-19 | Disposition: A | Discharge status: Home / Self Care | Payer: Medicare Other | Attending: Gastroenterology | Admitting: Gastroenterology

## 2020-08-19 ENCOUNTER — Encounter
Admission: RE | Disposition: A | Discharge status: Home / Self Care | Payer: Self-pay | Source: Home / Self Care | Attending: Gastroenterology

## 2020-08-19 DIAGNOSIS — Z1211 Encounter for screening for malignant neoplasm of colon: Secondary | ICD-10-CM | POA: Insufficient documentation

## 2020-08-19 DIAGNOSIS — K640 First degree hemorrhoids: Secondary | ICD-10-CM | POA: Diagnosis not present

## 2020-08-19 DIAGNOSIS — E7800 Pure hypercholesterolemia, unspecified: Secondary | ICD-10-CM | POA: Insufficient documentation

## 2020-08-19 DIAGNOSIS — K5730 Diverticulosis of large intestine without perforation or abscess without bleeding: Secondary | ICD-10-CM | POA: Insufficient documentation

## 2020-08-19 DIAGNOSIS — Z79899 Other long term (current) drug therapy: Secondary | ICD-10-CM | POA: Diagnosis not present

## 2020-08-19 DIAGNOSIS — Z6841 Body Mass Index (BMI) 40.0 and over, adult: Secondary | ICD-10-CM | POA: Diagnosis not present

## 2020-08-19 DIAGNOSIS — Z7982 Long term (current) use of aspirin: Secondary | ICD-10-CM | POA: Diagnosis not present

## 2020-08-19 DIAGNOSIS — E669 Obesity, unspecified: Secondary | ICD-10-CM | POA: Diagnosis not present

## 2020-08-19 DIAGNOSIS — D123 Benign neoplasm of transverse colon: Secondary | ICD-10-CM | POA: Diagnosis not present

## 2020-08-19 DIAGNOSIS — Z800 Family history of malignant neoplasm of digestive organs: Secondary | ICD-10-CM | POA: Insufficient documentation

## 2020-08-19 DIAGNOSIS — Z882 Allergy status to sulfonamides status: Secondary | ICD-10-CM | POA: Insufficient documentation

## 2020-08-19 DIAGNOSIS — Z86010 Personal history of colonic polyps: Secondary | ICD-10-CM | POA: Insufficient documentation

## 2020-08-19 HISTORY — PX: COLONOSCOPY WITH PROPOFOL: SHX5780

## 2020-08-19 SURGERY — COLONOSCOPY WITH PROPOFOL
Anesthesia: Monitor Anesthesia Care

## 2020-08-19 MED ORDER — PROPOFOL 500 MG/50ML IV EMUL
INTRAVENOUS | Status: AC
  Filled 2020-08-19: qty 50

## 2020-08-19 MED ORDER — SODIUM CHLORIDE 0.9 % IV SOLN: INTRAVENOUS | Status: DC

## 2020-08-19 MED ORDER — PROPOFOL 500 MG/50ML IV EMUL
INTRAVENOUS | Status: DC | PRN
  Administered 2020-08-19: 100 ug/kg/min via INTRAVENOUS

## 2020-08-19 MED ORDER — LIDOCAINE HCL (CARDIAC) PF 100 MG/5ML IV SOSY
PREFILLED_SYRINGE | INTRAVENOUS | Status: DC | PRN
  Administered 2020-08-19: 50 mg via INTRAVENOUS

## 2020-08-19 MED ORDER — PROPOFOL 10 MG/ML IV BOLUS
INTRAVENOUS | Status: DC | PRN
  Administered 2020-08-19: 70 mg via INTRAVENOUS

## 2020-08-19 NOTE — Op Note (Signed)
[facility] Patient Name: Seline Enzor Procedure Date: 08/19/2020 10:35 AM MRN: [id number] Account #: [id number] Date of Birth: 07/28/1950 Admit Type: Outpatient Age: 70 Room: [facility] ENDO ROOM 3 Gender: Female Note Status: Finalized Procedure:             Colonoscopy Indications:           Surveillance: Personal history of adenomatous polyps                         on last colonoscopy > 5 years ago, Family history of                         colon cancer in a first-degree relative before age 46                         years Providers:             Andrey Farmer MD, MD Medicines:             Monitored Anesthesia Care Complications:         No immediate complications. Estimated blood loss:                         Minimal. Procedure:             Pre-Anesthesia Assessment:                        - Prior to the procedure, a History and Physical was                         performed, and patient medications and allergies were                         reviewed. The patient is competent. The risks and                         benefits of the procedure and the sedation options and                         risks were discussed with the patient. All questions                         were answered and informed consent was obtained.                         Patient identification and proposed procedure were                         verified by the physician, the nurse, the anesthetist                         and the technician in the endoscopy suite. Mental                         Status Examination: alert and oriented. Airway                         Examination: normal oropharyngeal airway and neck  mobility. Respiratory Examination: clear to                         auscultation. CV Examination: normal. Prophylactic                         Antibiotics: The patient does not require prophylactic                         antibiotics. Prior  Anticoagulants: The patient has                         taken no previous anticoagulant or antiplatelet                         agents. ASA Grade Assessment: III - A patient with                         severe systemic disease. After reviewing the risks and                         benefits, the patient was deemed in satisfactory                         condition to undergo the procedure. The anesthesia                         plan was to use monitored anesthesia care (MAC).                         Immediately prior to administration of medications,                         the patient was re-assessed for adequacy to receive                         sedatives. The heart rate, respiratory rate, oxygen                         saturations, blood pressure, adequacy of pulmonary                         ventilation, and response to care were monitored                         throughout the procedure. The physical status of the                         patient was re-assessed after the procedure.                        After obtaining informed consent, the colonoscope was                         passed under direct vision. Throughout the procedure,                         the patient's blood pressure, pulse, and oxygen  saturations were monitored continuously. The                         Colonoscope was introduced through the anus and                         advanced to the the cecum, identified by appendiceal                         orifice and ileocecal valve. The colonoscopy was                         somewhat difficult due to significant looping.                         Successful completion of the procedure was aided by                         applying abdominal pressure. The patient tolerated the                         procedure well. The quality of the bowel preparation                         was excellent. Findings:      The perianal and digital rectal examinations  were normal.      A 3 mm polyp was found in the proximal transverse colon. The polyp was       sessile. The polyp was removed with a cold snare. Resection and       retrieval were complete. Estimated blood loss was minimal.      Scattered small-mouthed diverticula were found in the sigmoid colon,       descending colon and transverse colon.      Internal hemorrhoids were found during retroflexion. The hemorrhoids       were Grade I (internal hemorrhoids that do not prolapse).      The exam was otherwise without abnormality on direct and retroflexion       views. Impression:            - One 3 mm polyp in the proximal transverse colon,                         removed with a cold snare. Resected and retrieved.                        - Diverticulosis in the sigmoid colon, in the                         descending colon and in the transverse colon.                        - Internal hemorrhoids.                        - The examination was otherwise normal on direct and                         retroflexion views. Recommendation:        - Discharge  patient to home.                        - Resume previous diet.                        - Continue present medications.                        - Await pathology results.                        - Repeat colonoscopy in 5 years for surveillance.                        - Return to referring physician as previously                         scheduled. Procedure Code(s):     --- Professional ---                        [phone], Colonoscopy, flexible; with removal of                         tumor(s), polyp(s), or other lesion(s) by snare                         technique Diagnosis Code(s):     --- Professional ---                        Z86.010, Personal history of colonic polyps                        K63.5, Polyp of colon                        K64.0, First degree hemorrhoids                        Z80.0, Family history of malignant neoplasm of                          digestive organs                        K57.30, Diverticulosis of large intestine without                         perforation or abscess without bleeding CPT copyright 2019 American Medical Association. All rights reserved. The codes documented in this report are preliminary and upon coder review may  be revised to meet current compliance requirements. Andrey Farmer MD, MD 08/19/2020 11:16:48 AM Number of Addenda: 0 Note Initiated On: 08/19/2020 10:35 AM Scope Withdrawal Time: 0 hours 7 minutes 45 seconds  Total Procedure Duration: 0 hours 14 minutes 39 seconds  Estimated Blood Loss:  Estimated blood loss was minimal.      [facility]

## 2020-08-19 NOTE — Transfer of Care (Signed)
Immediate Anesthesia Transfer of Care Note  Patient: Kaitlyn Ponce  Procedure(s) Performed: COLONOSCOPY WITH PROPOFOL  Patient Location: PACU  Anesthesia Type:General  Level of Consciousness: drowsy  Airway & Oxygen Therapy: Patient Spontanous Breathing and Patient connected to face mask oxygen  Post-op Assessment: Report given to RN and Post -op Vital signs reviewed and stable  Post vital signs: Reviewed and stable  Last Vitals:  Vitals Value Taken Time  BP 106/43 08/19/20 1117  Temp 35.9 C 08/19/20 1117  Pulse 71 08/19/20 1120  Resp 17 08/19/20 1120  SpO2 100 % 08/19/20 1120  Vitals shown include unvalidated device data.  Last Pain:  Vitals:   08/19/20 1117  TempSrc: Temporal  PainSc: Asleep         Complications: No notable events documented.

## 2020-08-19 NOTE — Anesthesia Preprocedure Evaluation (Signed)
Anesthesia Evaluation  Patient identified by MRN, date of birth, ID band Patient awake    Reviewed: Allergy & Precautions, H&P , NPO status , Patient's Chart, lab work & pertinent test results, reviewed documented beta blocker date and time   History of Anesthesia Complications (+) PONV and history of anesthetic complications  Airway Mallampati: II   Neck ROM: full    Dental  (+) Poor Dentition   Pulmonary neg pulmonary ROS, shortness of breath and with exertion,    Pulmonary exam normal        Cardiovascular Exercise Tolerance: Poor negative cardio ROS Normal cardiovascular exam Rhythm:regular Rate:Normal     Neuro/Psych negative neurological ROS  negative psych ROS   GI/Hepatic negative GI ROS, Neg liver ROS, GERD  Medicated,  Endo/Other  negative endocrine ROS  Renal/GU negative Renal ROS  negative genitourinary   Musculoskeletal  (+) Arthritis ,   Abdominal   Peds  Hematology negative hematology ROS (+)   Anesthesia Other Findings Past Medical History: No date: Allergy     Comment:  cat, dust No date: Arthritis No date: Dyspnea No date: GERD (gastroesophageal reflux disease) No date: High cholesterol     Comment:  taking medication, declined nutritional referral at this              time 10/11/17 No date: History of tonsillectomy No date: PONV (postoperative nausea and vomiting) Past Surgical History: 2010: BREAST BIOPSY; Left     Comment:  [place] core 07/01/2018: PAROTIDECTOMY; N/A     Comment:  Procedure: PAROTIDECTOMY;  Surgeon: Carloyn Manner,               MD;  Location: [facility] ORS;  Service: ENT;  Laterality: N/A; No date: TONSILLECTOMY 1987: TUBAL LIGATION 2002: VAGINAL HYSTERECTOMY BMI    Body Mass Index: 42.74 kg/m     Reproductive/Obstetrics negative OB ROS                             Anesthesia Physical  Anesthesia Plan  ASA: 3  Anesthesia Plan: MAC    Post-op Pain Management:    Induction: Intravenous  PONV Risk Score and Plan: 1 and Propofol infusion and TIVA  Airway Management Planned: Mask and Natural Airway  Additional Equipment:   Intra-op Plan:   Post-operative Plan:   Informed Consent: I have reviewed the patients History and Physical, chart, labs and discussed the procedure including the risks, benefits and alternatives for the proposed anesthesia with the patient or authorized representative who has indicated his/her understanding and acceptance.     Dental Advisory Given  Plan Discussed with: CRNA and Anesthesiologist  Anesthesia Plan Comments: (IVGA, PIV x 1, ASA SM, RBOP discussed, Consent signed.)        Anesthesia Quick Evaluation

## 2020-08-19 NOTE — Anesthesia Postprocedure Evaluation (Signed)
Anesthesia Post Note  Patient: Kaitlyn Ponce  Procedure(s) Performed: COLONOSCOPY WITH PROPOFOL  Patient location during evaluation: PACU Anesthesia Type: MAC Level of consciousness: awake and alert Pain management: pain level controlled Vital Signs Assessment: post-procedure vital signs reviewed and stable Respiratory status: spontaneous breathing Cardiovascular status: blood pressure returned to baseline Postop Assessment: no headache Anesthetic complications: no   No notable events documented.   Last Vitals:  Vitals:   08/19/20 1127 08/19/20 1137  BP: (!) 100/58 139/72  Pulse: 70 68  Resp: 15 18  Temp:    SpO2: 100% 100%    Last Pain:  Vitals:   08/19/20 1137  TempSrc:   PainSc: 0-No pain                 Milinda Pointer

## 2020-08-19 NOTE — Interval H&P Note (Signed)
History and Physical Interval Note:  08/19/2020 10:51 AM  Kaitlyn Ponce  has presented today for surgery, with the diagnosis of PH polyp.  The various methods of treatment have been discussed with the patient and family. After consideration of risks, benefits and other options for treatment, the patient has consented to  Procedure(s): COLONOSCOPY WITH PROPOFOL (N/A) as a surgical intervention.  The patient's history has been reviewed, patient examined, no change in status, stable for surgery.  I have reviewed the patient's chart and labs.  Questions were answered to the patient's satisfaction.     Lesly Rubenstein  Ok to proceed with colonoscopy

## 2020-08-19 NOTE — H&P (Signed)
Outpatient short stay form Pre-procedure 08/19/2020 10:47 AM Raylene Miyamoto MD, MPH  Primary Physician: Dr. Ellison Hughs  Reason for visit:  Family history of colon cancer  History of present illness:    70 y/o lady with history of obesity and HLD here for surveillance colonoscopy. Has history of polyps but no polyps on colonoscopy done in 2015. Family history of colon cancer in brother at the age of 42. No blood thinners. No significant abdominal surgeries.    Current Facility-Administered Medications:    0.9 %  sodium chloride infusion, , Intravenous, Continuous, Jourdyn Ferrin, Hilton Cork, MD, Last Rate: 20 mL/hr at 08/19/20 1021, New Bag at 08/19/20 1021  Medications Prior to Admission  Medication Sig Dispense Refill Last Dose   Acetaminophen (TYLENOL 8 HOUR PO) Take by mouth.   Past Month   calcium carbonate (TUMS - DOSED IN MG ELEMENTAL CALCIUM) 500 MG chewable tablet Chew 1 tablet by mouth at bedtime as needed for indigestion or heartburn.   08/17/2020   aspirin 81 MG chewable tablet Chew 1 tablet (81 mg total) by mouth 2 (two) times daily. (Patient not taking: Reported on 08/19/2020) 60 tablet 0 Not Taking   docusate sodium (COLACE) 100 MG capsule Take 1 capsule (100 mg total) by mouth 2 (two) times daily. (Patient not taking: Reported on 08/19/2020) 10 capsule 0 Not Taking   HYDROcodone-acetaminophen (NORCO/VICODIN) 5-325 MG tablet Take 1 tablet by mouth every 4 (four) hours as needed for moderate pain (pain score 4-6). (Patient not taking: Reported on 08/19/2020) 30 tablet 0 Not Taking   Iodoquinol-HC-Aloe Polysacch (ALCORTIN A) 1-2-1 % GEL Apply 1 application topically 2 (two) times daily as needed (rash/irritated skin.).  (Patient not taking: Reported on 08/19/2020)   Not Taking   pravastatin (PRAVACHOL) 20 MG tablet Take 20 mg by mouth at bedtime.    08/17/2020     Allergies  Allergen Reactions   Sulfa Antibiotics Rash     Past Medical History:  Diagnosis Date   Allergy    cat,  dust   Arthritis    Dyspnea    GERD (gastroesophageal reflux disease)    High cholesterol    taking medication, declined nutritional referral at this time 10/11/17   History of tonsillectomy    PONV (postoperative nausea and vomiting)     Review of systems:  Otherwise negative.    Physical Exam  Gen: Alert, oriented. Appears stated age.  HEENT: PERRLA. Lungs: No respiratory distress CV: RRR Abd: soft, benign, no masses Ext: No edema    Planned procedures: Proceed with colonoscopy. The patient understands the nature of the planned procedure, indications, risks, alternatives and potential complications including but not limited to bleeding, infection, perforation, damage to internal organs and possible oversedation/side effects from anesthesia. The patient agrees and gives consent to proceed.  Please refer to procedure notes for findings, recommendations and patient disposition/instructions.     Raylene Miyamoto MD, MPH Gastroenterology 08/19/2020  10:47 AM

## 2020-08-22 LAB — SURGICAL PATHOLOGY

## 2020-09-27 IMAGING — DX DG KNEE 1-2V PORT*R*
2 series · 2 of 2 positions shown · non-contrast
Comparison: 12/12/2007

CLINICAL DATA: Postop knee replacement

EXAM:
PORTABLE RIGHT KNEE - 1-2 VIEW

[knee ap]
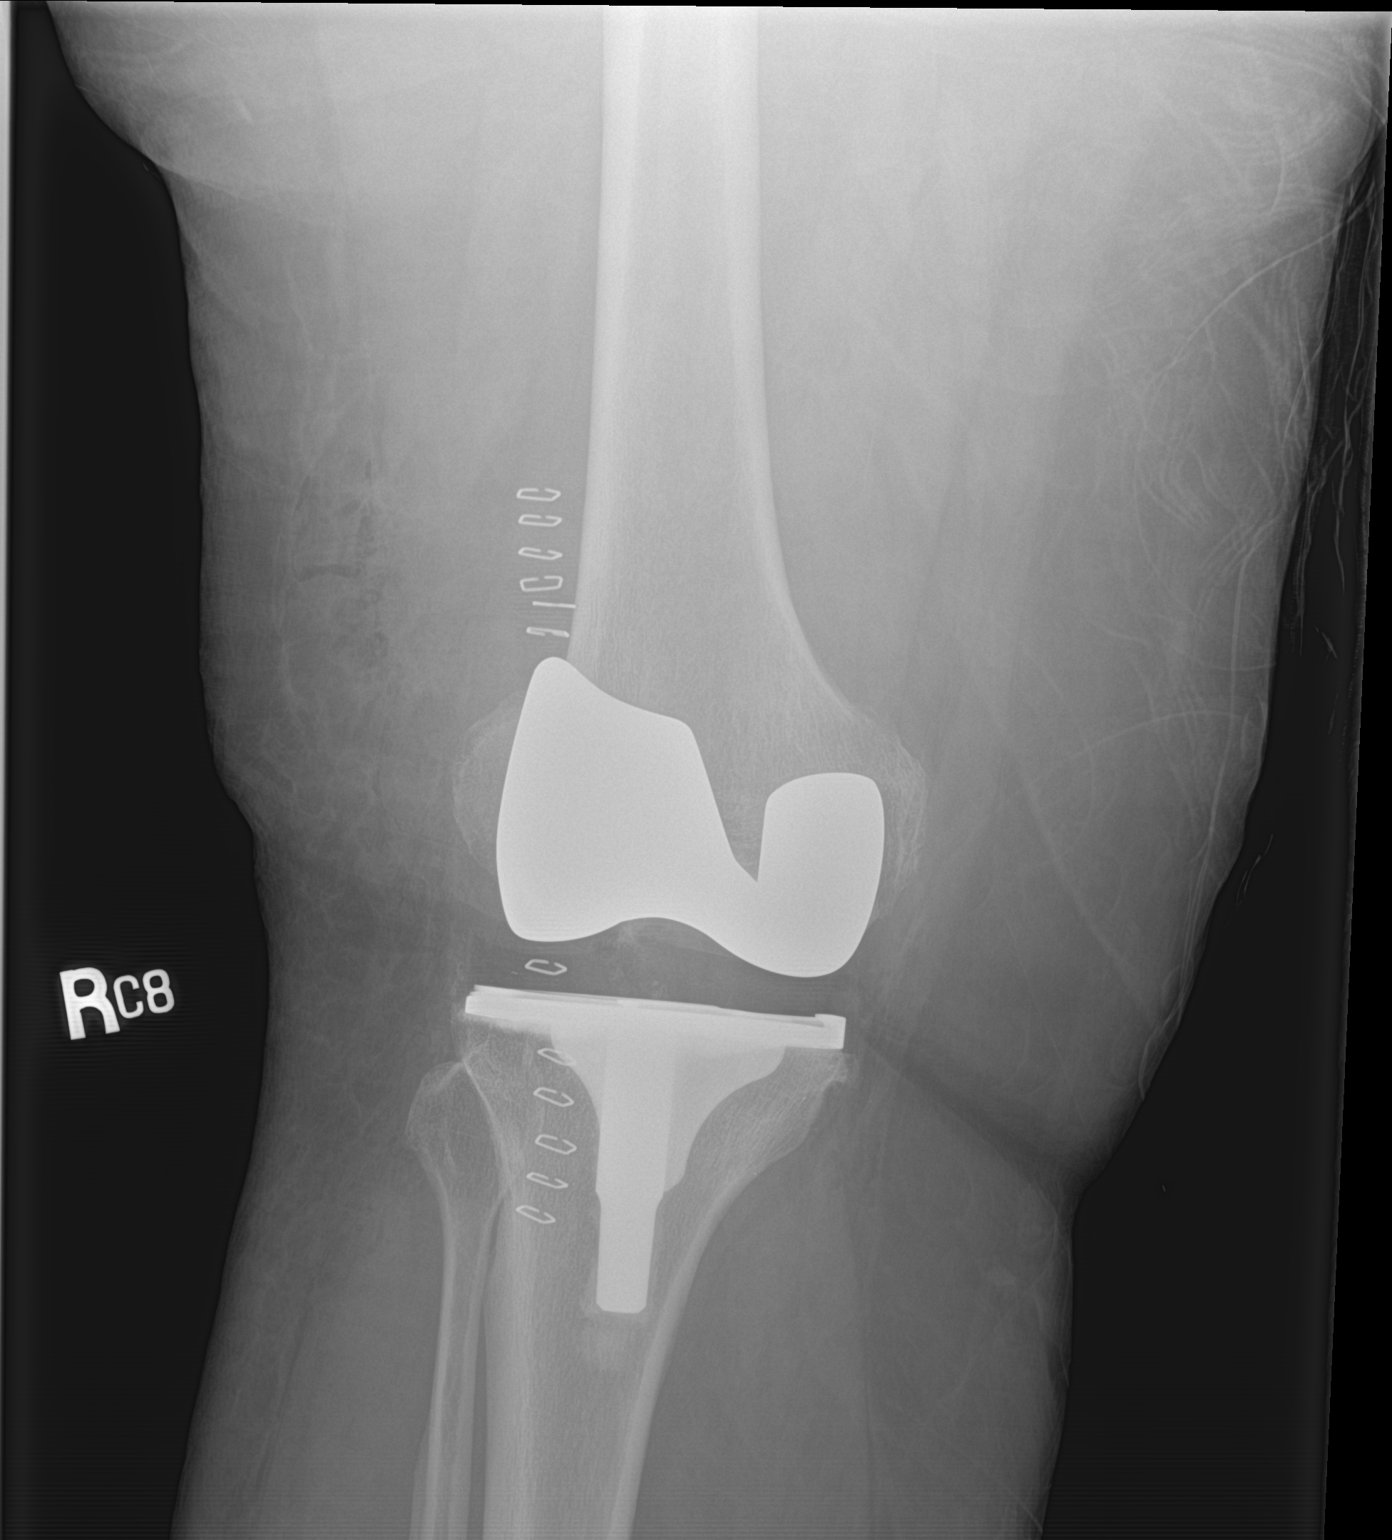

[knee lat]
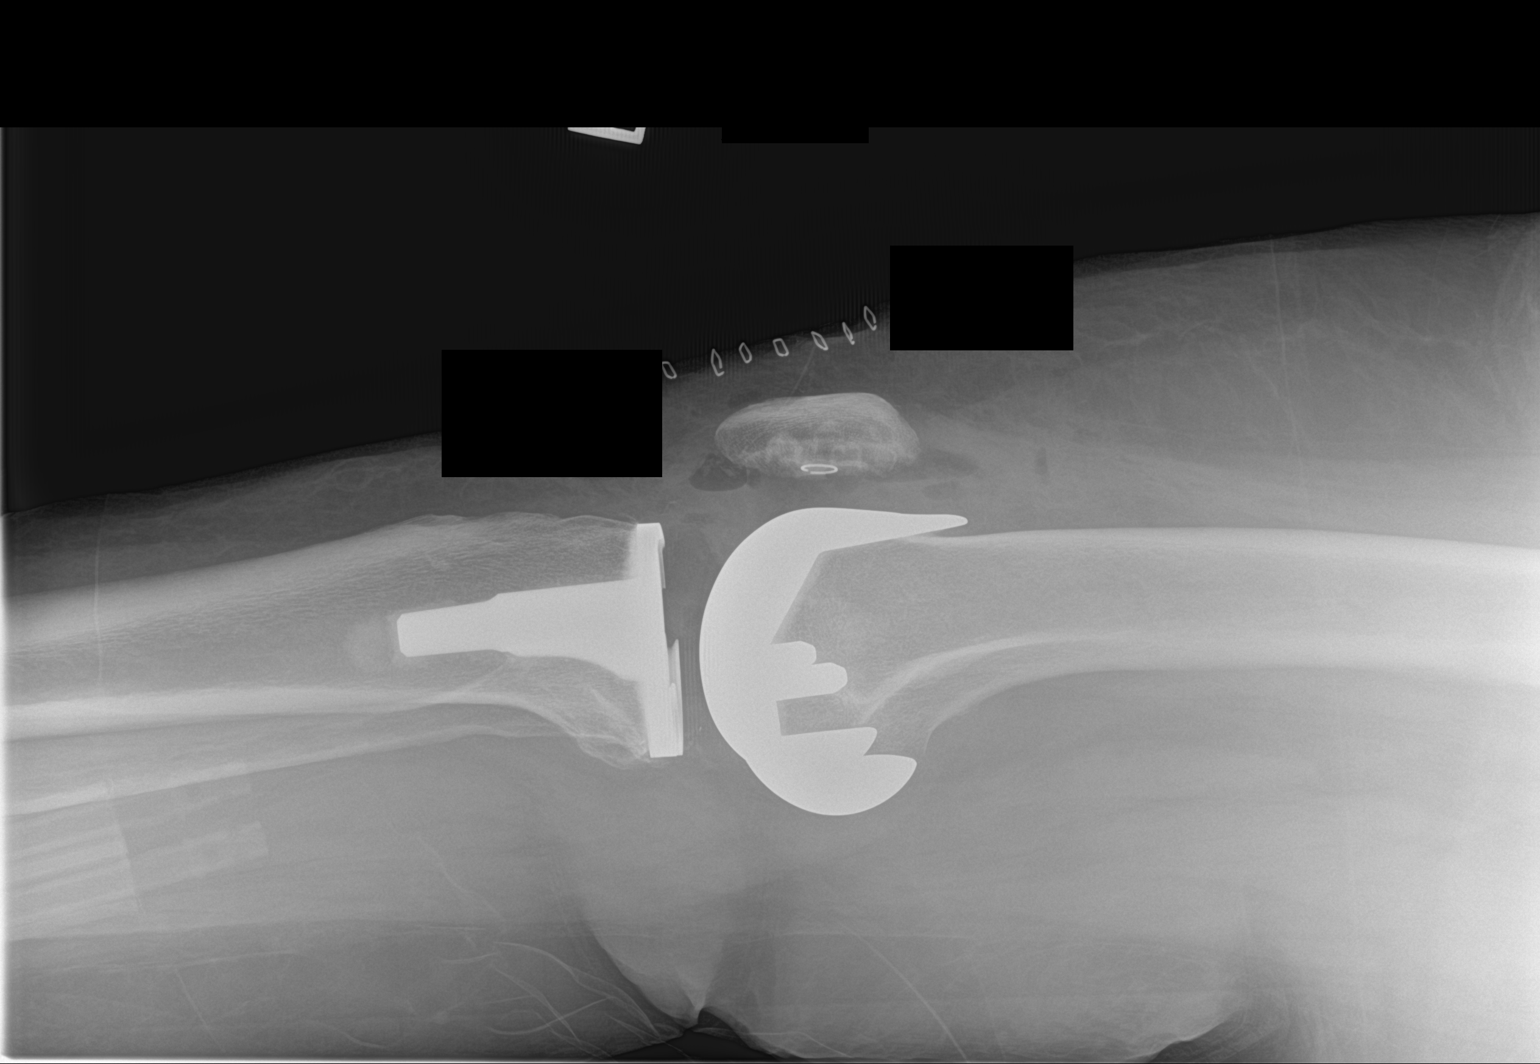

[2 of 2 positions shown; findings below may reference images not displayed]

FINDINGS: Status post right knee replacement with intact hardware and normal
alignment. Gas within the soft tissues and joint space consistent
with recent surgery. Cutaneous staples over the knee. No fracture
IMPRESSION: Status post right knee replacement with expected postsurgical
changes

## 2020-12-13 IMAGING — MG DIGITAL SCREENING BILAT W/ TOMO W/ CAD
8 series · 8 of 24 positions shown · non-contrast
Comparison: Previous exam(s).

CLINICAL DATA: Screening.

EXAM:
DIGITAL SCREENING BILATERAL MAMMOGRAM WITH TOMO AND CAD

[R MLO synth-2D]
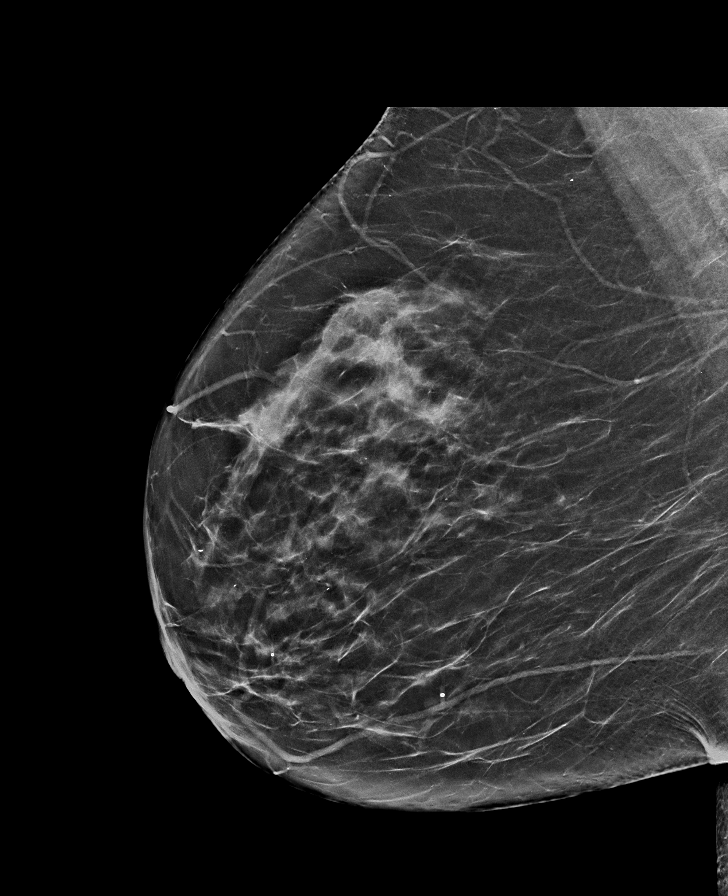

[R CC synth-2D]
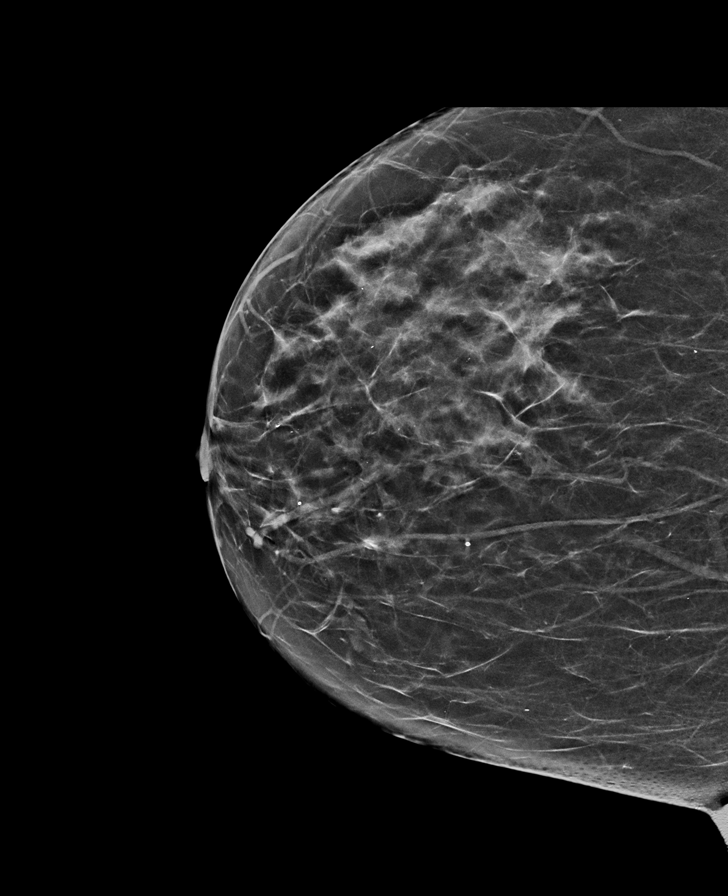

[L MLO synth-2D]
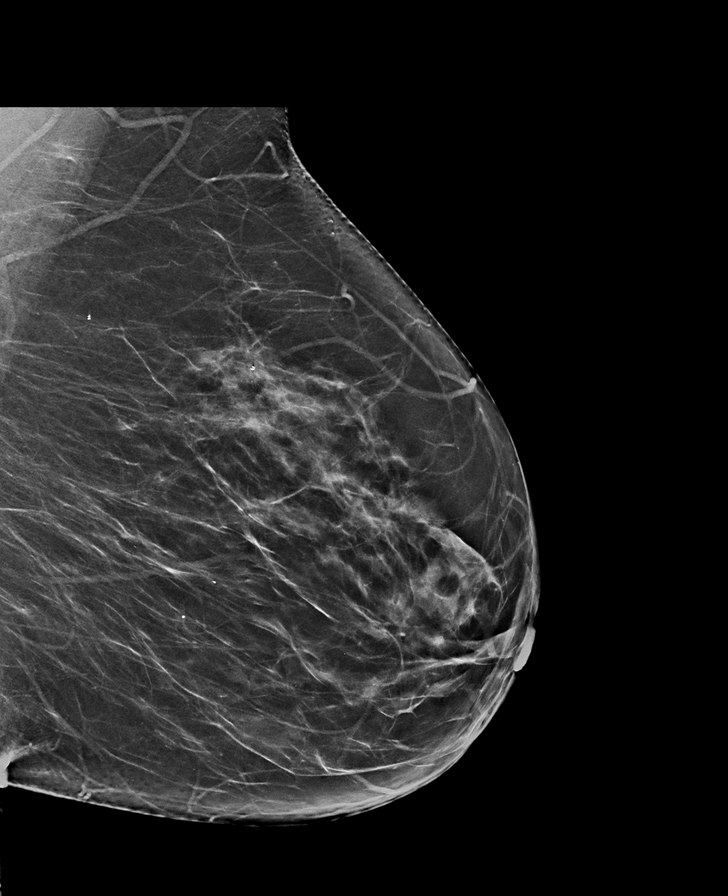

[L CC synth-2D]
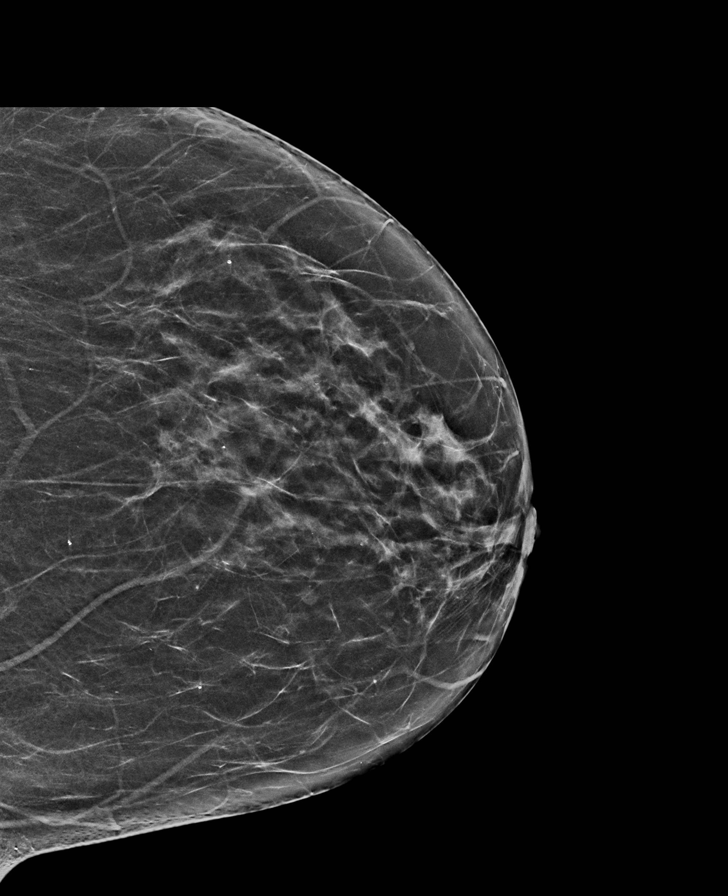

[L CC tomo · tomo slice 33/65.0]
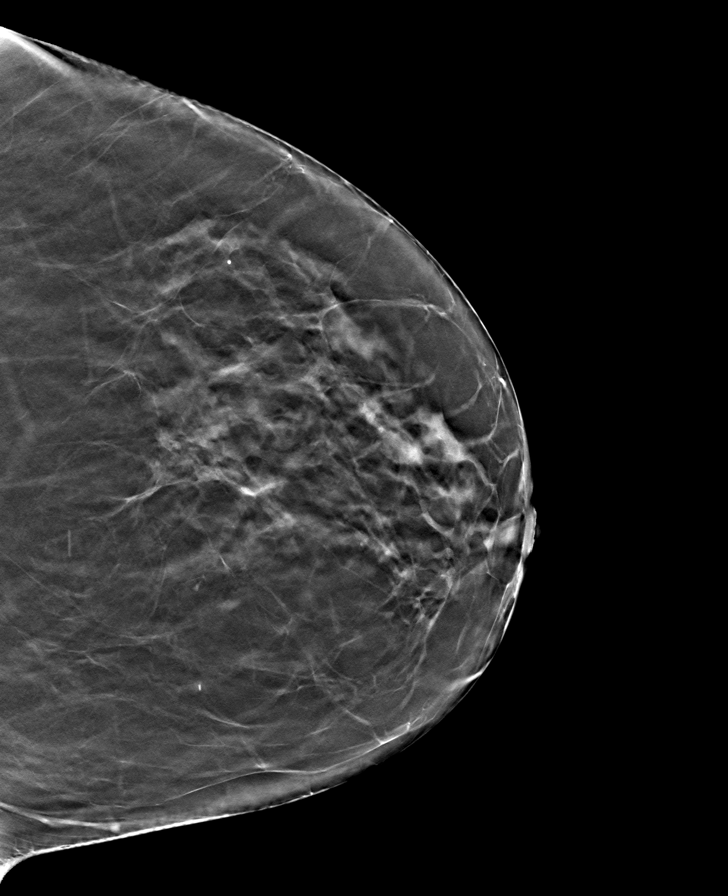

[R MLO tomo · tomo slice 37/74.0]
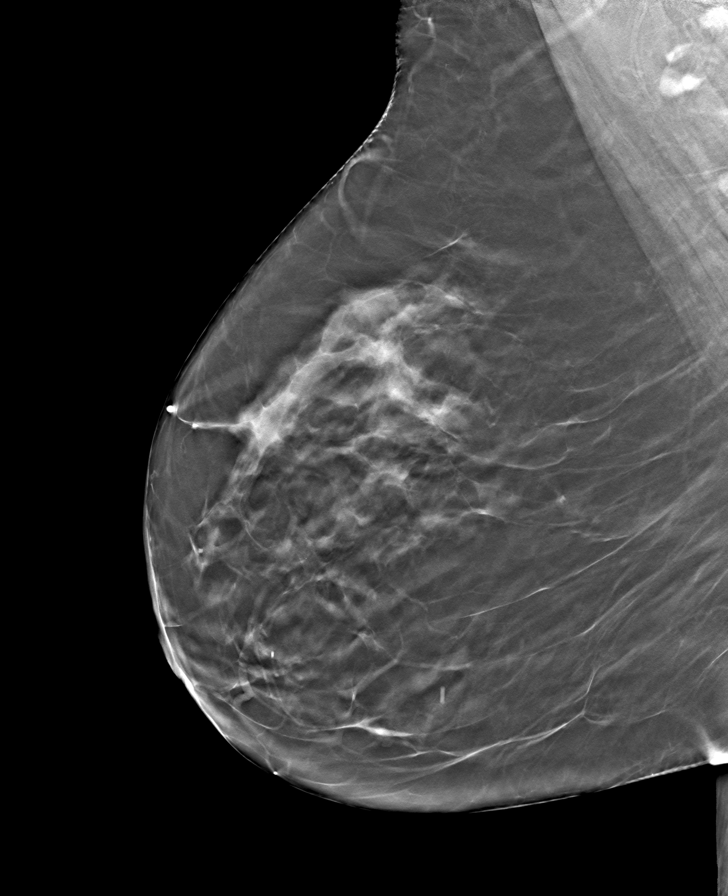

[R CC tomo · tomo slice 33/64.0]
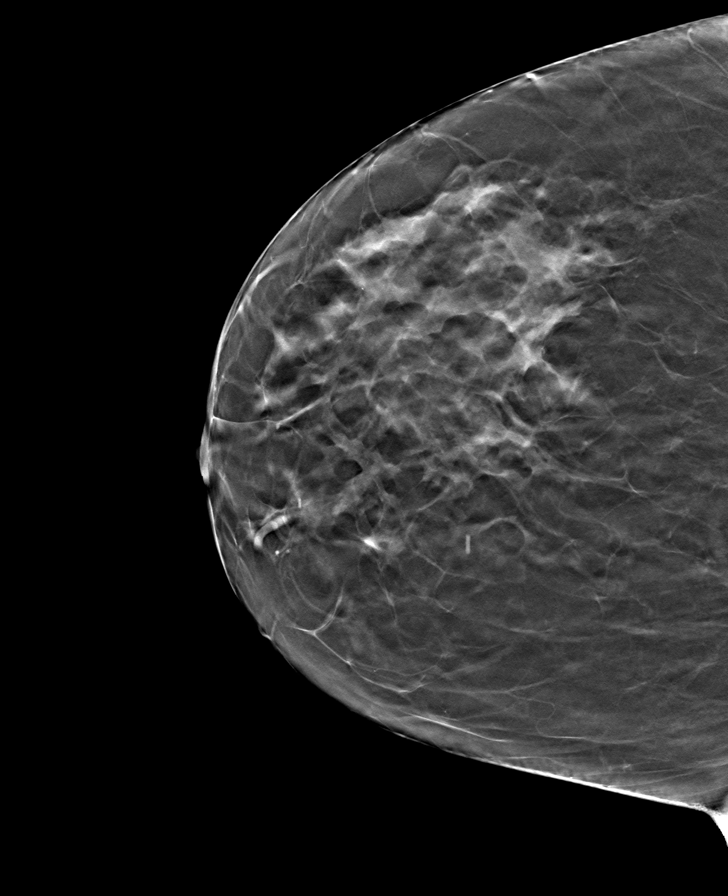

[L MLO tomo · tomo slice 37/73.0]
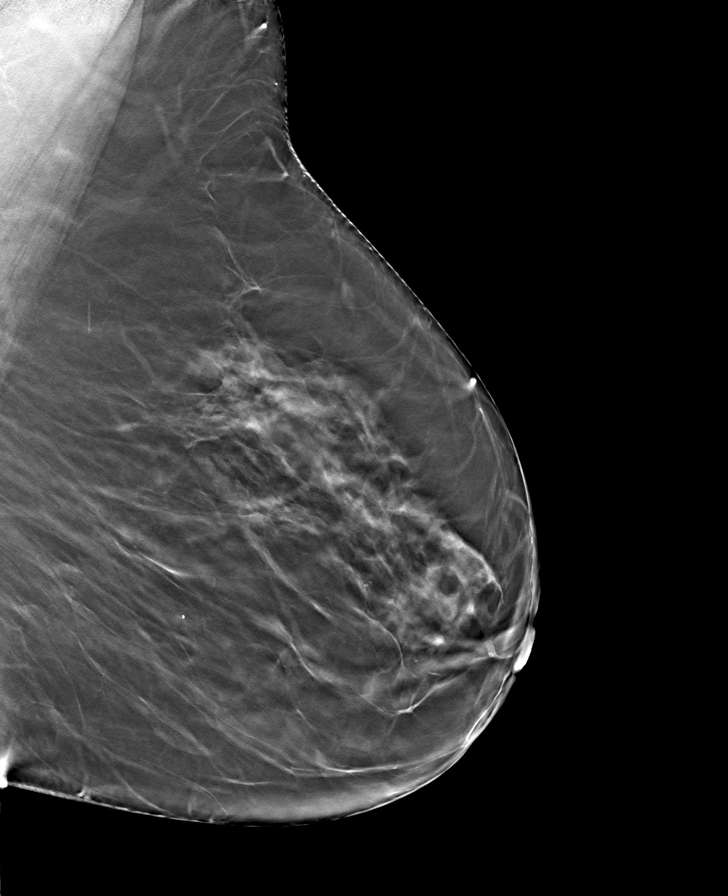

[8 of 24 positions shown; findings below may reference images not displayed]

ACR Breast Density Category b: There are scattered areas of
fibroglandular density.
FINDINGS: There are no findings suspicious for malignancy. Images were
processed with CAD.
IMPRESSION: No mammographic evidence of malignancy. A result letter of this
screening mammogram will be mailed directly to the patient.

RECOMMENDATION:
Screening mammogram in one year. (Code:[id number])

BI-RADS CATEGORY  1: Negative.

## 2021-01-31 ENCOUNTER — Other Ambulatory Visit: Payer: Self-pay | Admitting: Family Medicine

## 2021-01-31 DIAGNOSIS — Z1231 Encounter for screening mammogram for malignant neoplasm of breast: Secondary | ICD-10-CM

## 2021-03-06 ENCOUNTER — Other Ambulatory Visit: Payer: Self-pay

## 2021-03-06 ENCOUNTER — Ambulatory Visit
Admission: RE | Admit: 2021-03-06 | Discharge: 2021-03-06 | Disposition: A | Discharge status: Home / Self Care | Payer: Medicare Other | Source: Ambulatory Visit | Attending: Family Medicine | Admitting: Family Medicine

## 2021-03-06 DIAGNOSIS — Z1231 Encounter for screening mammogram for malignant neoplasm of breast: Secondary | ICD-10-CM | POA: Diagnosis present

## 2022-01-29 ENCOUNTER — Other Ambulatory Visit: Payer: Self-pay | Admitting: Family Medicine

## 2022-01-29 DIAGNOSIS — Z1231 Encounter for screening mammogram for malignant neoplasm of breast: Secondary | ICD-10-CM

## 2022-03-07 ENCOUNTER — Ambulatory Visit
Admission: RE | Admit: 2022-03-07 | Discharge: 2022-03-07 | Disposition: A | Discharge status: Home / Self Care | Payer: Medicare Other | Source: Ambulatory Visit | Attending: Family Medicine | Admitting: Family Medicine

## 2022-03-07 DIAGNOSIS — Z1231 Encounter for screening mammogram for malignant neoplasm of breast: Secondary | ICD-10-CM | POA: Diagnosis present

## 2022-03-12 ENCOUNTER — Other Ambulatory Visit: Payer: Self-pay | Admitting: Family Medicine

## 2022-03-12 DIAGNOSIS — N6489 Other specified disorders of breast: Secondary | ICD-10-CM

## 2022-03-12 DIAGNOSIS — R928 Other abnormal and inconclusive findings on diagnostic imaging of breast: Secondary | ICD-10-CM

## 2022-03-21 ENCOUNTER — Ambulatory Visit
Admission: RE | Admit: 2022-03-21 | Discharge: 2022-03-21 | Disposition: A | Discharge status: Home / Self Care | Payer: Medicare Other | Source: Ambulatory Visit | Attending: Family Medicine | Admitting: Family Medicine

## 2022-03-21 DIAGNOSIS — N6489 Other specified disorders of breast: Secondary | ICD-10-CM

## 2022-03-21 DIAGNOSIS — R928 Other abnormal and inconclusive findings on diagnostic imaging of breast: Secondary | ICD-10-CM

## 2023-02-13 ENCOUNTER — Other Ambulatory Visit: Payer: Self-pay | Admitting: Family Medicine

## 2023-02-13 DIAGNOSIS — Z1231 Encounter for screening mammogram for malignant neoplasm of breast: Secondary | ICD-10-CM

## 2023-03-21 ENCOUNTER — Ambulatory Visit
Admission: RE | Admit: 2023-03-21 | Discharge: 2023-03-21 | Disposition: A | Discharge status: Home / Self Care | Payer: Medicare Other | Source: Ambulatory Visit | Attending: Family Medicine | Admitting: Family Medicine

## 2023-03-21 DIAGNOSIS — Z1231 Encounter for screening mammogram for malignant neoplasm of breast: Secondary | ICD-10-CM | POA: Insufficient documentation

## 2024-02-18 ENCOUNTER — Other Ambulatory Visit: Payer: Self-pay | Admitting: Family Medicine

## 2024-02-18 DIAGNOSIS — Z1231 Encounter for screening mammogram for malignant neoplasm of breast: Secondary | ICD-10-CM

## 2024-03-24 ENCOUNTER — Encounter
